# Patient Record
Sex: Female | Born: 1945 | Race: White | Hispanic: Yes | Marital: Married | Smoking: Never smoker
Health system: Southern US, Community
[De-identification: ages and names within clinical notes are randomized; demographics above are authoritative.]

## PROBLEM LIST (undated history)

## (undated) DIAGNOSIS — K3184 Gastroparesis: Secondary | ICD-10-CM

## (undated) DIAGNOSIS — I10 Essential (primary) hypertension: Secondary | ICD-10-CM

## (undated) DIAGNOSIS — M1990 Unspecified osteoarthritis, unspecified site: Secondary | ICD-10-CM

## (undated) DIAGNOSIS — F419 Anxiety disorder, unspecified: Secondary | ICD-10-CM

## (undated) DIAGNOSIS — E785 Hyperlipidemia, unspecified: Secondary | ICD-10-CM

## (undated) DIAGNOSIS — F32A Depression, unspecified: Secondary | ICD-10-CM

## (undated) DIAGNOSIS — D649 Anemia, unspecified: Secondary | ICD-10-CM

## (undated) DIAGNOSIS — T7840XA Allergy, unspecified, initial encounter: Secondary | ICD-10-CM

## (undated) DIAGNOSIS — Z5189 Encounter for other specified aftercare: Secondary | ICD-10-CM

## (undated) DIAGNOSIS — J40 Bronchitis, not specified as acute or chronic: Secondary | ICD-10-CM

## (undated) DIAGNOSIS — F329 Major depressive disorder, single episode, unspecified: Secondary | ICD-10-CM

## (undated) DIAGNOSIS — K760 Fatty (change of) liver, not elsewhere classified: Secondary | ICD-10-CM

## (undated) DIAGNOSIS — K219 Gastro-esophageal reflux disease without esophagitis: Secondary | ICD-10-CM

## (undated) HISTORY — DX: Depression, unspecified: F32.A

## (undated) HISTORY — DX: Major depressive disorder, single episode, unspecified: F32.9

## (undated) HISTORY — PX: LITHOTRIPSY: SUR834

## (undated) HISTORY — DX: Hyperlipidemia, unspecified: E78.5

## (undated) HISTORY — DX: Anxiety disorder, unspecified: F41.9

## (undated) HISTORY — DX: Fatty (change of) liver, not elsewhere classified: K76.0

## (undated) HISTORY — PX: CARDIAC SURGERY: SHX584

## (undated) HISTORY — DX: Allergy, unspecified, initial encounter: T78.40XA

## (undated) HISTORY — DX: Gastro-esophageal reflux disease without esophagitis: K21.9

## (undated) HISTORY — DX: Unspecified osteoarthritis, unspecified site: M19.90

## (undated) HISTORY — DX: Bronchitis, not specified as acute or chronic: J40

## (undated) HISTORY — DX: Gastroparesis: K31.84

## (undated) HISTORY — DX: Essential (primary) hypertension: I10

## (undated) HISTORY — PX: APPENDECTOMY: SHX54

## (undated) HISTORY — DX: Encounter for other specified aftercare: Z51.89

## (undated) HISTORY — DX: Anemia, unspecified: D64.9

---

## 1999-01-14 ENCOUNTER — Other Ambulatory Visit: Admission: RE | Admit: 1999-01-14 | Discharge: 1999-01-14 | Payer: Self-pay | Admitting: Gynecology

## 2000-02-24 ENCOUNTER — Other Ambulatory Visit: Admission: RE | Admit: 2000-02-24 | Discharge: 2000-02-24 | Payer: Self-pay | Admitting: Gynecology

## 2000-02-25 ENCOUNTER — Encounter (INDEPENDENT_AMBULATORY_CARE_PROVIDER_SITE_OTHER): Payer: Self-pay | Admitting: Specialist

## 2000-02-25 ENCOUNTER — Other Ambulatory Visit: Admission: RE | Admit: 2000-02-25 | Discharge: 2000-02-25 | Payer: Self-pay | Admitting: Gynecology

## 2000-05-11 ENCOUNTER — Ambulatory Visit (HOSPITAL_COMMUNITY): Admission: RE | Admit: 2000-05-11 | Discharge: 2000-05-11 | Payer: Self-pay | Admitting: Gynecology

## 2000-05-11 ENCOUNTER — Encounter (INDEPENDENT_AMBULATORY_CARE_PROVIDER_SITE_OTHER): Payer: Self-pay

## 2000-08-26 ENCOUNTER — Encounter (INDEPENDENT_AMBULATORY_CARE_PROVIDER_SITE_OTHER): Payer: Self-pay | Admitting: Specialist

## 2000-08-26 ENCOUNTER — Other Ambulatory Visit: Admission: RE | Admit: 2000-08-26 | Discharge: 2000-08-26 | Payer: Self-pay | Admitting: Gynecology

## 2001-02-23 ENCOUNTER — Other Ambulatory Visit: Admission: RE | Admit: 2001-02-23 | Discharge: 2001-02-23 | Payer: Self-pay | Admitting: Gynecology

## 2002-03-02 ENCOUNTER — Encounter: Admission: RE | Admit: 2002-03-02 | Discharge: 2002-03-02 | Payer: Self-pay | Admitting: Family Medicine

## 2002-03-02 ENCOUNTER — Encounter: Payer: Self-pay | Admitting: Family Medicine

## 2003-01-03 ENCOUNTER — Other Ambulatory Visit: Admission: RE | Admit: 2003-01-03 | Discharge: 2003-01-03 | Payer: Self-pay | Admitting: Gynecology

## 2004-03-06 ENCOUNTER — Other Ambulatory Visit: Admission: RE | Admit: 2004-03-06 | Discharge: 2004-03-06 | Payer: Self-pay | Admitting: Gynecology

## 2004-11-02 ENCOUNTER — Encounter (INDEPENDENT_AMBULATORY_CARE_PROVIDER_SITE_OTHER): Payer: Self-pay | Admitting: *Deleted

## 2004-11-02 LAB — CONVERTED CEMR LAB: Pap Smear: NORMAL

## 2005-07-16 ENCOUNTER — Encounter: Admission: RE | Admit: 2005-07-16 | Discharge: 2005-07-16 | Payer: Self-pay | Admitting: Family Medicine

## 2005-08-13 ENCOUNTER — Other Ambulatory Visit: Admission: RE | Admit: 2005-08-13 | Discharge: 2005-08-13 | Payer: Self-pay | Admitting: Gynecology

## 2005-09-09 ENCOUNTER — Ambulatory Visit: Payer: Self-pay | Admitting: Pulmonary Disease

## 2006-06-03 ENCOUNTER — Ambulatory Visit: Payer: Self-pay | Admitting: Family Medicine

## 2006-06-18 ENCOUNTER — Ambulatory Visit: Payer: Self-pay | Admitting: Gastroenterology

## 2006-07-01 ENCOUNTER — Ambulatory Visit: Payer: Self-pay | Admitting: Family Medicine

## 2006-07-28 ENCOUNTER — Ambulatory Visit: Payer: Self-pay | Admitting: Family Medicine

## 2006-08-06 ENCOUNTER — Ambulatory Visit: Payer: Self-pay | Admitting: Family Medicine

## 2006-08-25 ENCOUNTER — Ambulatory Visit: Payer: Self-pay | Admitting: Family Medicine

## 2006-09-30 ENCOUNTER — Ambulatory Visit: Payer: Self-pay | Admitting: Family Medicine

## 2006-09-30 LAB — CONVERTED CEMR LAB
Albumin: 3.8 g/dL (ref 3.5–5.2)
Alkaline Phosphatase: 84 units/L (ref 39–117)
CO2: 30 meq/L (ref 19–32)
Glucose, Bld: 90 mg/dL (ref 70–99)
Total Bilirubin: 0.9 mg/dL (ref 0.3–1.2)
Total Protein: 7.2 g/dL (ref 6.0–8.3)

## 2006-11-16 DIAGNOSIS — F32A Depression, unspecified: Secondary | ICD-10-CM | POA: Insufficient documentation

## 2006-11-16 DIAGNOSIS — F329 Major depressive disorder, single episode, unspecified: Secondary | ICD-10-CM

## 2006-11-16 DIAGNOSIS — F419 Anxiety disorder, unspecified: Secondary | ICD-10-CM

## 2007-01-04 ENCOUNTER — Ambulatory Visit: Payer: Self-pay | Admitting: Gastroenterology

## 2007-01-04 LAB — CONVERTED CEMR LAB
AST: 21 units/L (ref 0–37)
Albumin: 3.6 g/dL (ref 3.5–5.2)
Amylase: 54 units/L (ref 27–131)
Bilirubin, Direct: 0.2 mg/dL (ref 0.0–0.3)
Creatinine, Ser: 0.7 mg/dL (ref 0.4–1.2)
Eosinophils Absolute: 0.1 10*3/uL (ref 0.0–0.6)
Eosinophils Relative: 1.9 % (ref 0.0–5.0)
Folate: >20 ng/mL
Glucose, Bld: 103 mg/dL — ABNORMAL HIGH (ref 70–99)
Hemoglobin: 13 g/dL (ref 12.0–15.0)
Lymphocytes Relative: 21.1 % (ref 12.0–46.0)
MCV: 87.6 fL (ref 78.0–100.0)
Monocytes Absolute: 0.3 10*3/uL (ref 0.2–0.7)
Monocytes Relative: 5.3 % (ref 3.0–11.0)
Neutro Abs: 4.4 10*3/uL (ref 1.4–7.7)
Platelets: 303 10*3/uL (ref 150–400)
Potassium: 4.2 meq/L (ref 3.5–5.1)
Sed Rate: 34 mm/hr — ABNORMAL HIGH (ref 0–25)
Sodium: 141 meq/L (ref 135–145)
Total Bilirubin: 0.8 mg/dL (ref 0.3–1.2)
Total Protein: 6.9 g/dL (ref 6.0–8.3)
WBC: 6.1 10*3/uL (ref 4.5–10.5)

## 2007-01-07 ENCOUNTER — Ambulatory Visit: Payer: Self-pay | Admitting: Gastroenterology

## 2007-01-10 ENCOUNTER — Ambulatory Visit: Payer: Self-pay | Admitting: Gastroenterology

## 2007-01-10 ENCOUNTER — Encounter (INDEPENDENT_AMBULATORY_CARE_PROVIDER_SITE_OTHER): Payer: Self-pay | Admitting: Specialist

## 2007-02-10 ENCOUNTER — Ambulatory Visit: Payer: Self-pay | Admitting: Gastroenterology

## 2007-07-06 ENCOUNTER — Telehealth (INDEPENDENT_AMBULATORY_CARE_PROVIDER_SITE_OTHER): Payer: Self-pay | Admitting: *Deleted

## 2008-02-14 ENCOUNTER — Other Ambulatory Visit: Admission: RE | Admit: 2008-02-14 | Discharge: 2008-02-14 | Payer: Self-pay | Admitting: Gynecology

## 2009-02-19 ENCOUNTER — Ambulatory Visit: Payer: Self-pay | Admitting: Gynecology

## 2009-02-19 ENCOUNTER — Encounter: Payer: Self-pay | Admitting: Gynecology

## 2009-02-19 ENCOUNTER — Other Ambulatory Visit: Admission: RE | Admit: 2009-02-19 | Discharge: 2009-02-19 | Payer: Self-pay | Admitting: Gynecology

## 2009-03-01 ENCOUNTER — Encounter: Payer: Self-pay | Admitting: Internal Medicine

## 2009-05-01 ENCOUNTER — Ambulatory Visit: Payer: Self-pay | Admitting: Internal Medicine

## 2009-05-01 ENCOUNTER — Ambulatory Visit: Payer: Self-pay | Admitting: Diagnostic Radiology

## 2009-05-01 ENCOUNTER — Ambulatory Visit (HOSPITAL_BASED_OUTPATIENT_CLINIC_OR_DEPARTMENT_OTHER): Admission: RE | Admit: 2009-05-01 | Discharge: 2009-05-01 | Payer: Self-pay | Admitting: Internal Medicine

## 2009-05-01 DIAGNOSIS — E785 Hyperlipidemia, unspecified: Secondary | ICD-10-CM | POA: Insufficient documentation

## 2009-05-02 HISTORY — PX: OTHER SURGICAL HISTORY: SHX169

## 2009-05-07 ENCOUNTER — Ambulatory Visit: Payer: Self-pay | Admitting: Cardiovascular Disease

## 2009-05-08 ENCOUNTER — Encounter: Payer: Self-pay | Admitting: Internal Medicine

## 2009-05-08 ENCOUNTER — Telehealth: Payer: Self-pay | Admitting: Internal Medicine

## 2009-05-14 ENCOUNTER — Encounter (INDEPENDENT_AMBULATORY_CARE_PROVIDER_SITE_OTHER): Payer: Self-pay | Admitting: *Deleted

## 2009-05-21 ENCOUNTER — Encounter: Payer: Self-pay | Admitting: Internal Medicine

## 2009-05-21 ENCOUNTER — Ambulatory Visit: Payer: Self-pay

## 2009-05-29 ENCOUNTER — Encounter (INDEPENDENT_AMBULATORY_CARE_PROVIDER_SITE_OTHER): Payer: Self-pay | Admitting: *Deleted

## 2009-07-10 ENCOUNTER — Telehealth (INDEPENDENT_AMBULATORY_CARE_PROVIDER_SITE_OTHER): Payer: Self-pay | Admitting: *Deleted

## 2009-08-02 ENCOUNTER — Ambulatory Visit: Payer: Self-pay | Admitting: Internal Medicine

## 2009-08-02 DIAGNOSIS — J309 Allergic rhinitis, unspecified: Secondary | ICD-10-CM | POA: Insufficient documentation

## 2009-08-08 ENCOUNTER — Encounter (INDEPENDENT_AMBULATORY_CARE_PROVIDER_SITE_OTHER): Payer: Self-pay | Admitting: *Deleted

## 2009-08-08 LAB — CONVERTED CEMR LAB
BUN: 18 mg/dL (ref 6–23)
CO2: 29 meq/L (ref 19–32)
Calcium: 9 mg/dL (ref 8.4–10.5)
Chloride: 107 meq/L (ref 96–112)
Creatinine, Ser: 0.7 mg/dL (ref 0.4–1.2)
Glucose, Bld: 82 mg/dL (ref 70–99)
HDL: 38.6 mg/dL — ABNORMAL LOW (ref >39.00)
LDL Cholesterol: 87 mg/dL (ref 0–99)
Total CHOL/HDL Ratio: 4
VLDL: 26.6 mg/dL (ref 0.0–40.0)

## 2009-11-21 ENCOUNTER — Telehealth (INDEPENDENT_AMBULATORY_CARE_PROVIDER_SITE_OTHER): Payer: Self-pay | Admitting: *Deleted

## 2009-11-22 ENCOUNTER — Ambulatory Visit: Payer: Self-pay | Admitting: Internal Medicine

## 2009-11-22 DIAGNOSIS — M1990 Unspecified osteoarthritis, unspecified site: Secondary | ICD-10-CM | POA: Insufficient documentation

## 2009-12-27 ENCOUNTER — Ambulatory Visit: Payer: Self-pay | Admitting: Gynecology

## 2010-01-08 ENCOUNTER — Telehealth (INDEPENDENT_AMBULATORY_CARE_PROVIDER_SITE_OTHER): Payer: Self-pay | Admitting: *Deleted

## 2010-03-07 ENCOUNTER — Ambulatory Visit: Payer: Self-pay | Admitting: Internal Medicine

## 2010-05-02 ENCOUNTER — Encounter: Payer: Self-pay | Admitting: Internal Medicine

## 2010-05-20 ENCOUNTER — Telehealth (INDEPENDENT_AMBULATORY_CARE_PROVIDER_SITE_OTHER): Payer: Self-pay | Admitting: *Deleted

## 2010-06-11 ENCOUNTER — Ambulatory Visit: Payer: Self-pay | Admitting: Internal Medicine

## 2010-06-11 DIAGNOSIS — J019 Acute sinusitis, unspecified: Secondary | ICD-10-CM | POA: Insufficient documentation

## 2010-06-23 ENCOUNTER — Telehealth: Payer: Self-pay | Admitting: Internal Medicine

## 2010-07-14 ENCOUNTER — Telehealth: Payer: Self-pay | Admitting: Internal Medicine

## 2010-07-16 ENCOUNTER — Ambulatory Visit: Payer: Self-pay | Admitting: Internal Medicine

## 2010-07-17 ENCOUNTER — Ambulatory Visit: Payer: Self-pay | Admitting: Internal Medicine

## 2010-07-17 ENCOUNTER — Telehealth: Payer: Self-pay | Admitting: Internal Medicine

## 2010-07-25 ENCOUNTER — Telehealth (INDEPENDENT_AMBULATORY_CARE_PROVIDER_SITE_OTHER): Payer: Self-pay | Admitting: *Deleted

## 2010-08-08 ENCOUNTER — Ambulatory Visit: Payer: Self-pay | Admitting: Internal Medicine

## 2010-08-11 ENCOUNTER — Encounter (INDEPENDENT_AMBULATORY_CARE_PROVIDER_SITE_OTHER): Payer: Self-pay | Admitting: *Deleted

## 2010-08-13 ENCOUNTER — Ambulatory Visit: Payer: Self-pay | Admitting: Internal Medicine

## 2010-08-15 LAB — CONVERTED CEMR LAB
AST: 21 units/L (ref 0–37)
Basophils Absolute: 0 10*3/uL (ref 0.0–0.1)
Cholesterol: 179 mg/dL (ref 0–200)
Eosinophils Relative: 2.3 % (ref 0.0–5.0)
HCT: 37.4 % (ref 36.0–46.0)
Hemoglobin: 12.6 g/dL (ref 12.0–15.0)
Lymphocytes Relative: 27 % (ref 12.0–46.0)
Lymphs Abs: 1.3 10*3/uL (ref 0.7–4.0)
Monocytes Relative: 5.3 % (ref 3.0–12.0)
Neutro Abs: 3.1 10*3/uL (ref 1.4–7.7)
Platelets: 266 10*3/uL (ref 150.0–400.0)
RDW: 15.4 % — ABNORMAL HIGH (ref 11.5–14.6)
TSH: 1.71 microintl units/mL (ref 0.35–5.50)
Triglycerides: 139 mg/dL (ref 0.0–149.0)
WBC: 4.8 10*3/uL (ref 4.5–10.5)

## 2010-08-18 ENCOUNTER — Encounter: Payer: Self-pay | Admitting: Internal Medicine

## 2010-08-22 ENCOUNTER — Encounter: Payer: Self-pay | Admitting: Internal Medicine

## 2010-08-26 ENCOUNTER — Encounter: Payer: Self-pay | Admitting: Internal Medicine

## 2010-11-23 ENCOUNTER — Encounter: Payer: Self-pay | Admitting: Family Medicine

## 2010-12-02 NOTE — Progress Notes (Signed)
Summary: discussed chest x-ray  Phone Note Outgoing Call   Summary of Call: called ----> no answer Jose E. Paz MD  July 17, 2010 1:57 PM   Follow-up for Phone Call        I spoke with the patient's daughter in reference to the chest x-ray results "Question of retained suture needle right post lateral chest wall."  the daughter reports that the only surgery was her heart which was 67 years old. They were not aware of this situation. Plan: Will call if she has any pain, swelling, redness in that area Follow-up by: Jose E. Paz MD,  July 18, 2010 1:16 PM

## 2010-12-02 NOTE — Progress Notes (Signed)
Summary: NEEDS NEW ANTIBIOTIC  Phone Note Call from Patient   Caller: Patient Summary of Call: PATIENT'S DAUGHTER CALLED TO REPORT THAT [facility] MOM'S ANTIBIOTIC IS MAKING HER SICK, SO SHE HAS STOPPED TAKING IT  SINCE WE HAD NO SIGNED DESIGNATED PARTY RELEASE FROM PATIENT, I TOLD DAUGHTER I COULD NOT SPEAK TO HER--AND I ASKED  HER TO HAVE HER MOM CALL  PATIENT DID CALL AND ASKED FOR NEW ANTIBIOTIC FOR [facility] [address]---I SAID I WOULD TELL DR PAZ  Initial call taken by: Jerolyn Shin,  July 14, 2010 1:08 PM  Follow-up for Phone Call        was seen more than a month ago with sinusitis. If she still needs antibiotics for a sinus infection, she actually needs to be seen.please arrange Follow-up by: Jose E. Paz MD,  July 14, 2010 4:04 PM  Additional Follow-up for Phone Call Additional follow up Details #1::        Left message for pt to call back. Army Fossa CMA  July 14, 2010 4:07 PM     Additional Follow-up for Phone Call Additional follow up Details #2::    Unable to reach pt, everytime I call a female who only speaks spanish and noone to translate. Army Fossa CMA  July 15, 2010 1:34 PM called, no answer Jose E. Paz MD  July 15, 2010 2:00 PM   Additional Follow-up for Phone Call Additional follow up Details #3:: Details for Additional Follow-up Action Taken: Patient came in with a personal translator asking about her antibiotic. I informed her that she needed to be seen today to be able to get a new rx for the abx. Patient made appt for this morning.  Additional Follow-up by: Harold Barban,  July 16, 2010 11:14 AM

## 2010-12-02 NOTE — Progress Notes (Signed)
Summary: [facility] 3/9  Phone Note Call from Patient Call back at [phone]   Caller: Daughter Summary of Call: Request refill on lipitor to [facility] Left message on machine for daughter -- which [facility] pharmacy? Regina Ray  January 08, 2010 8:40 AM     Prescriptions: LIPITOR 10 MG TABS (ATORVASTATIN CALCIUM) 1/2 a day  #15 x 3   Entered by:   Regina Ray   Authorized by:   Nolon Rod. Paz MD   Signed by:   Regina Ray on 01/08/2010   Method used:   Electronically to        [facility] [address] [place] [facility] (retail)       [id number]       [place], [place]  [address]       Ph: [phone]       Fax: [phone]   RxID:   [id number]

## 2010-12-02 NOTE — Progress Notes (Signed)
Summary: BLEEDING DUE TO CONSTIPATION  Phone Note Call from Patient   [facility] - [phone] Summary of Call: SPOKE TO PATIENT ON PHONE---SHE SAID IT WAS OK TO TALK TO HER DAUGHTER  DAUGHTER SAYS THAT PATIENT IS HAVING CONSTIPATION PROBLEMS DUE TO ANTIBIOTIC---VERY DIFFICULT TO PASS STOOL ---HAS BLEEDING ISSUES---SO HAS STOPPED ANTIBIOTIC  DAUGHTER SAYS SHE STARTED PAPAYA AND YOGURT ON MONDAY TO "ADD FIBER", THEN ADDED PRUNE JUICE ON THURSDAY  HAS NO HISTORY OF BLEEDING ULCERS; SAYS BELLY FEELS HOT, HAS NOT HAD CRAMPING--  WHAT SHOULD SHE DO NEXT??   Initial call taken by: Jerolyn Shin,  July 25, 2010 3:15 PM  Follow-up for Phone Call        pt rx DOXYCYCLINE HYCLATE 100 MG and prednisone pls advise.Marti Sleigh Deloach CMA  July 25, 2010 4:38 PM   spoke w/ patient daughter says she had episode of on monday and today where she couldn't make a BM has been straining due to constipation has since increase fluids and drinking prune just but has been going informed daughter that she may have hemorrhoid states that blood in bright red but very minimal informed to continue medication along w/ recommendations and call if needed...Marland KitchenMarland KitchenDoristine Devoid CMA  July 25, 2010 5:09 PM

## 2010-12-02 NOTE — Consult Note (Signed)
Summary: back pain eval---Dr [facility]   Imported By: Lanelle Bal 05/19/2010 12:40:55  _____________________________________________________________________  External Attachment:    Type:   Image     Comment:   External Document

## 2010-12-02 NOTE — Progress Notes (Signed)
Summary: NEEDS TWO PRESCRIPTIONS  Phone Note Call from Patient Call back at [phone]= [name] = DAUGHTER   Caller: Patient Summary of Call: PATIENT NEEDS RFILL FOR LIPITOR AND WANTS TO TRY HIGH BLOOD PRESSURE MEDICATION THAT SHE AND DR PAZ HAVE BEEN TALKING ABOUT  CALL [facility] FOR PRESCRIPTIONS Initial call taken by: Jerolyn Shin,  June 23, 2010 12:43 PM  Follow-up for Phone Call        OK Lipitor #30,3 refills Will discuss BP meds when she comes back. BP the last time she was here was okay She is due for a CPX on October, please schedule Follow-up by: [facility] E. Paz MD,  June 23, 2010 3:27 PM  Additional Follow-up for Phone Call Additional follow up Details #1::        pts daughter is aware, CPX schedule for October. Army Fossa CMA  June 23, 2010 3:32 PM     Prescriptions: LIPITOR 10 MG TABS (ATORVASTATIN CALCIUM) 1/2 a day  #30 x 3   Entered by:   Army Fossa CMA   Authorized by:   Nolon Rod. Paz MD   Signed by:   Army Fossa CMA on 06/23/2010   Method used:   Electronically to        [facility] [address] [place] (retail)       [phone]       [place], [place]  [address]       Ph: [phone]       Fax: [phone]   RxID:   [id number]

## 2010-12-02 NOTE — Assessment & Plan Note (Signed)
Summary: elevated bp/alr   Vital Signs:  Patient profile:   65 year old female Height:      58 inches Weight:      180.4 pounds BMI:     37.84 Pulse rate:   76 / minute BP sitting:   140 / 80  Vitals Entered By: Regina Ray (November 22, 2009 11:41 AM) CC: rov   History of Present Illness: several issues  her BP was checked one time at [facility] last week and he was in the 175 range, thinks she needs  totreatment  also complained of back pain and   left knee pain was eval.  by a chiropractor, then referred for local injections in her back in 2010, she had  3 injections but doesn't like to have any more because she is gaining weight, wonders if she needs to see a rheumatologist currently not taking any medicines  depression?   Spends a lot of time by herself at home , denies crying spells but she feels down and somehow tired. unable to explain her symptoms any further   Wonders if she needs an antidepressant   Current Medications (verified): 1)  Lipitor 10 Mg Tabs (Atorvastatin Calcium) .... 1/2 A Day 2)  Mvi 3)  Calcium, D  Allergies (verified): 1)  ! Zoloft 2)  ! * Decongestants 3)  ! Penicillin  Past History:  Past Medical History: Anxiety, Depression Hyperlipidemia Allergic rhinitis ?claudication: ABIs normal 05/2009   Osteoarthritis  Past Surgical History: Reviewed history from 05/01/2009 and no changes required. Heart surgery @ age 21 (Intraauricular communication) Appendectomy  Social History: Reviewed history from 05/01/2009 and no changes required. tobacco--no no ETOH Married  Review of Systems       no chest pain occ.  has dizziness, symptoms are mild, no associated with nausea.  Symptoms ongoing on for years  Physical Exam  General:  alert and well-developed.   Lungs:  normal respiratory effort, no intercostal retractions, no accessory muscle use, and normal breath sounds.   Heart:  normal rate, regular rhythm, and no murmur.     Extremities:  No edema   Impression & Recommendations:  Problem # 1:  ? of ELEVATED BLOOD PRESSURE (ICD-796.2) explained to patient that the diagnosis of hypertension is based on several readings BP today  normal Plan: Check BP every two days low salt diet reassess in one month  Problem # 2:  ? of DEPRESSION (ICD-311) hard to say if she is depressed recommend observation for now  Problem # 3:  OSTEOARTHRITIS (ICD-715.90) per pt : her gynecologist refer her to a chiropractor, then she was referred for local injections, see HPI she wonders if needs to see a rheumatologist, I recommend to try medication first Plan: tylenol  Vicodin if pain persists instructions provided in Spanish  The following medications were removed from the medication list:    Aspirin 81 Mg Tbec (Aspirin) .Marland Kitchen... 1 a day Her updated medication list for this problem includes:    Vicodin Es 7.5-750 Mg Tabs (Hydrocodone-acetaminophen) .Marland Kitchen... 1 every 6 hours as needed pain  Complete Medication List: 1)  Lipitor 10 Mg Tabs (Atorvastatin calcium) .... 1/2 a day 2)  Mvi  3)  Calcium, D  4)  Vicodin Es 7.5-750 Mg Tabs (Hydrocodone-acetaminophen) .Marland Kitchen.. 1 every 6 hours as needed pain  Patient Instructions: 1)  tylenol 500 mg 1 o 2 pastillas cada 6 horas solo si tiene dolor 2)  si el dolor es severo, tome vicodin 1 pastilla cada 6  horas  y no tylenol. Vicodin puede causar somnolencia 3)  chequee su presion 3 o 4 veces por semana, debe estar menos de 140/80. Si esta siempre mas alta que 140/80 , por favor llamenos 4)  dieta baja de sal 5)  regrese en un mes  6)  Please schedule a follow-up appointment in 1 month.  Prescriptions: VICODIN ES 7.5-750 MG TABS (HYDROCODONE-ACETAMINOPHEN) 1 every 6 hours as needed pain  #40 x 0   Entered and Authorized by:   Nolon Rod. Paz MD   Signed by:   Nolon Rod. Paz MD on 11/22/2009   Method used:   Print then Give to Patient   RxID:   [id number]

## 2010-12-02 NOTE — Assessment & Plan Note (Signed)
Summary: SINUS INF////[facility]   Vital Signs:  Patient profile:   65 year old female Weight:      177.25 pounds Pulse rate:   74 / minute Pulse rhythm:   regular BP sitting:   130 / 72  (left arm) Cuff size:   regular  Vitals Entered By: Army Fossa CMA (June 11, 2010 2:54 PM) CC: sinus infection?  Comments HA Ears hurt x 5 days   History of Present Illness: 2 days history of "sinus infection" right ear ache, sinus pressure and congestion, mild cough with some sputum She also feels dizzy/ "imbalance" when she stands up or move her head has taken some Advil  ROS No fever some chest congestion congestion Has a headache, global Denies a slurred speech, focal deficits or diplopia Had nausea for a few hours with the onset of symptoms   Current Medications (verified): 1)  Lipitor 10 Mg Tabs (Atorvastatin Calcium) .... 1/2 A Day  Allergies: 1)  ! Zoloft 2)  ! * Decongestants 3)  ! Penicillin  Past History:  Past Medical History: Reviewed history from 11/22/2009 and no changes required. Anxiety, Depression Hyperlipidemia Allergic rhinitis ?claudication: ABIs normal 05/2009   Osteoarthritis  Past Surgical History: Reviewed history from 05/01/2009 and no changes required. Heart surgery @ age 66 (Intraauricular communication) Appendectomy  Social History: Reviewed history from 05/01/2009 and no changes required. tobacco--no no ETOH Married  Review of Systems      See HPI  Physical Exam  General:  alert and well-developed.   Head:  face symmetric, bilateral tenderness in the maxillary sinuses, very mild in the frontal sinuses Ears:  R ear normal and L ear normal.   Nose:  slightly congested Mouth:  no redness or discharge Lungs:  normal respiratory effort, no intercostal retractions, no accessory muscle use, and normal breath sounds.   Heart:  normal rate, regular rhythm, and no murmur.   Neurologic:  EOMI  face is symmetric Speech fluent Gait  normal Psych:  not anxious appearing and not depressed appearing.     Impression & Recommendations:  Problem # 1:  SINUSITIS - ACUTE-NOS (ICD-461.9) symptoms consistent with mild sinusitis Patient is allergic to penicillin rest, fluids,   Bactrim (avoid excessive sun exposure), robitussin. See instructions ( in Spanish)  Complete Medication List: 1)  Lipitor 10 Mg Tabs (Atorvastatin calcium) .... 1/2 a day 2)  Bactrim Ds 800-160 Mg Tabs (Sulfamethoxazole-trimethoprim) .... One by mouth twice a day  for 10 days  Patient Instructions: 1)  descanse, tome muchos liquidos 2)  tylenol para el dolor de cabeza 3)  Robitussin DM para la tos  4)  bactrim, es el antibiotico 5)  llame si no esta mejor en unos dias 6)  llame si se pone peor, tiene fiebre alta , dolor de cabeza fuerte Prescriptions: BACTRIM DS 800-160 MG TABS (SULFAMETHOXAZOLE-TRIMETHOPRIM) one by mouth twice a day  for 10 days  #20 x 0   Entered and Authorized by:   Nolon Rod. Paz MD   Signed by:   Nolon Rod. Paz MD on 06/11/2010   Method used:   Electronically to        [facility] [address] (retail)       [phone]       [place], [place]  [address]       Ph: [phone]       Fax: [phone]   RxID:   [id number]

## 2010-12-02 NOTE — Progress Notes (Signed)
  Phone Note Call from Patient   Caller: Patient Summary of Call: called and left msg on VM  would like appt for tomorrow, please cal. Called pt back phone just rings no answering machine, will try later  Called pt back who c/o elevated bp (very little english) ov scheduled  Initial call taken by: Kandice Hams,  November 21, 2009 9:07 AM

## 2010-12-02 NOTE — Assessment & Plan Note (Signed)
Summary: CPX/drb   Vital Signs:  Patient profile:   65 year old female Height:      58 inches Weight:      176.25 pounds Pulse rate:   78 / minute Pulse rhythm:   regular BP sitting:   124 / 76  (left arm) Cuff size:   regular  Vitals Entered By: Army Fossa CMA (August 08, 2010 2:20 PM) CC: CPX, not fasting Comments [facility] [address] flu shot Had pneumovax 2 yearrs ago Shingles?? Due for bone density mammo and pap-- has GYN    History of Present Illness: Here for Medicare AWV:  1.   Risk factors based on Past M, S, F history: reviewed 2.   Physical Activities: sedentary life style  3.   Depression/mood: occasional depression, son in jail 4.   Hearing: denies problems, no problems noted  5.   ADL's: totally independent, does not drive  6.   Fall Risk: no recent falls  7.   Home Safety: does feel safe at home 8.   Height, weight, &visual acuity: see  vital signs, only needs reading glasses  9.   Counseling: see below 10.   Labs ordered based on risk factors: yes 11.           Referral Coordination, if needed 12.           Care Plan, see assessment and plan 13.            Cognitive Assessment , motor skil, memory and cognition seem appropiate   In addition, we discussed the following issues  Anxiety, Depression-- on no meds, doing relatively well  Hyperlipidemia-- good medication compliance  OA -- saw Dr Ethelene Hal 7-11, local injection helped    Preventive Screening-Counseling & Management  Alcohol-Tobacco     Smoking Status: never  Caffeine-Diet-Exercise     Does Patient Exercise: yes     Type of exercise: walking  Current Medications (verified): 1)  Lipitor 10 Mg Tabs (Atorvastatin Calcium) .... 1/2 A Day  Allergies (verified): 1)  ! Zoloft 2)  ! * Decongestants 3)  ! Penicillin  Past History:  Past Medical History: Anxiety, Depression Hyperlipidemia Allergic rhinitis ?claudication: ABIs normal 05/2009 Osteoarthritis fatty liver per ultrasound  2008 EGD showed gastroparesis 2008  Past Surgical History: Reviewed history from 05/01/2009 and no changes required. Heart surgery @ age 85 Paramedic communication) Appendectomy  Social History: married, lives w/ husband 4 children does not drive tobacco--no ETOH--no   Smoking Status:  never Does Patient Exercise:  yes  Review of Systems General:  occasional fatigue . CV:  Denies chest pain or discomfort and swelling of feet. Resp:  Denies cough and shortness of breath. GI:  Denies bloody stools, diarrhea, nausea, and vomiting; occasional constipation . Neuro:  Denies poor balance; no HA.  Physical Exam  General:  alert, well-developed, and overweight-appearing.   Neck:  no masses and no thyromegaly.   Chest Wall:  no obvious mass on the right chest Lungs:  normal respiratory effort, no intercostal retractions, no accessory muscle use, and normal breath sounds.   Heart:  normal rate, regular rhythm, and no murmur.   Abdomen:  soft, non-tender, no distention, no masses, no guarding, and no rigidity.   Extremities:  no pretibial edema bilaterally  Psych:  Cognition and judgment appear intact. Alert and cooperative with normal attention span and concentration, not anxious appearing and not depressed appearing.     Impression & Recommendations:  Problem # 1:  HEALTH  SCREENING (ICD-V70.0)  TD-- years ago , will get another one on return to the office Pneumonia shot-- aprox 2009 Shingles immunization--information provided flu shot today   not on ASA d/t GI s/e   overdue to see  Dr. Lily Peer , encouraged to go , patient will call  Pap smear  per gyn  Mammogram-negative   01-2009, overdue , I'll schedule it  Bone density test-- had it done before at gyn, rec to D/W Dr Lily Peer   Colonoscopy 01-2007===== negative except for hyperplastic polyps, Dr. Jarold Motto  diet and exercise discussed  Orders: Radiology Referral (Radiology) Medicare -1st Annual Wellness  Visit [phone])  Problem # 2:  foreign body retained needle in the right side of the chest per last chest x-ray, it was an incidental finding. The patient likes to recheck on that in a few months  Problem # 3:  ? of ELEVATED BLOOD PRESSURE (ICD-796.2) BP normal BP today: 124/76 Prior BP: 130/86 (07/16/2010)     Problem # 4:  OSTEOARTHRITIS (ICD-715.90) symptoms relatively well controlled now  Problem # 5:  HYPERLIPIDEMIA (ICD-272.4) due for labs Her updated medication list for this problem includes:    Lipitor 10 Mg Tabs (Atorvastatin calcium) .Marland Kitchen... 1/2 a day  Labs Reviewed: SGOT: 28 (08/02/2009)   SGPT: 25 (08/02/2009)   HDL:38.60 (08/02/2009)  LDL:87 (08/02/2009)  Chol:152 (08/02/2009)  Trig:133.0 (08/02/2009)  Problem # 6:  ANXIETY (ICD-300.00) on no medications, seems to be doing okay  Complete Medication List: 1)  Lipitor 10 Mg Tabs (Atorvastatin calcium) .... 1/2 a day  Other Orders: Flu Vaccine 57yrs + MEDICARE PATIENTS (U0454) Administration Flu vaccine - MCR (U9811)  Patient Instructions: 1)  came back fasting 2)  FLP, AST,  ALT, TSH---dx high cholesterol 3)  CBC--- elevated BP 4)  Come back in one year for routine checkup   Risk Factors:  Tobacco use:  never Alcohol use:  no Exercise:  yes    Type:  walking  Flu Vaccine Consent Questions     Do you have a history of severe allergic reactions to this vaccine? no    Any prior history of allergic reactions to egg and/or gelatin? no    Do you have a sensitivity to the preservative Thimersol? no    Do you have a past history of Guillan-Barre Syndrome? no    Do you currently have an acute febrile illness? no    Have you ever had a severe reaction to latex? no    Vaccine information given and explained to patient? yes    Are you currently pregnant? no    Lot Number:[id number]   Exp Date:05/02/2011   Site Given  Left Deltoid IM    [email or web address]

## 2010-12-02 NOTE — Progress Notes (Signed)
Summary: Blood Pressure Rx  Phone Note Call from Patient   Caller: Patient Summary of Call: pt walks in and states that she was told at her last ov that if she wanted an rx for high blood pressure that she could stop by or call to receive this rx. spoke with danielle and the medication is not in her list. please call patient back to discuss her options [phone] Initial call taken by: Lavell Islam,  May 20, 2010 2:52 PM  Follow-up for Phone Call        if her ambulatory BP is consistently more than 160-90 she needs medication If not, just diet and exercise. She is due for a general checkup I recommend her to schedule one and we can talk more at the time Follow-up by: Jose E. Paz MD,  May 21, 2010 10:24 AM  Additional Follow-up for Phone Call Additional follow up Details #1::        Spoke with pt and she will have her daughter call back to set up appt.Lavell Islam  May 21, 2010 10:34 AM    Additional Follow-up for Phone Call Additional follow up Details #2::    mailed letter.Lavell Islam  May 26, 2010 9:53 AM

## 2010-12-02 NOTE — Assessment & Plan Note (Signed)
Summary: SINUS INFECTION//[facility]   Vital Signs:  Patient profile:   65 year old female Weight:      177 pounds Temp:     97.7 degrees F oral Pulse rate:   73 / minute Pulse rhythm:   regular BP sitting:   130 / 86  (left arm) Cuff size:   regular  Vitals Entered By: Army Fossa CMA (July 16, 2010 11:17 AM) WithCC: Pt here c/o sinus infection?  Comments C/o head congestion HA's Chest congestion    History of Present Illness: was seen a month ago with mild sinusitis was prescribed  Bactrim Not doing any better Continue with sinus congestion also has frequent cough with sputum, sputum was initially green, now yellow. Few days ago she saw a few drops of blood on the sputum  ROS No itchy eyes, some itchy nose and clear nasal discharge had fever last week but none in the last few days as you're some chest congestion and even wheezing. Denies history of asthma or tobacco abuse Admit to chest pain in the tracheal area only with cough No nausea, vomiting, diarrhea no lower extremity edema  Current Medications (verified): 1)  Lipitor 10 Mg Tabs (Atorvastatin Calcium) .... 1/2 A Day  Allergies: 1)  ! Zoloft 2)  ! * Decongestants 3)  ! Penicillin  Past History:  Past Medical History: Reviewed history from 11/22/2009 and no changes required. Anxiety, Depression Hyperlipidemia Allergic rhinitis ?claudication: ABIs normal 05/2009   Osteoarthritis  Past Surgical History: Reviewed history from 05/01/2009 and no changes required. Heart surgery @ age 2 (Intraauricular communication) Appendectomy  Social History: Reviewed history from 05/01/2009 and no changes required. tobacco--no no ETOH Married  Physical Exam  General:  alert, well-developed, and overweight-appearing.   Head:  face symmetric, not tender and the maxillary sinuses, mild tenderness at bilateral frontal sinuses Ears:  R ear normal and L ear normal.   Nose:  congested, clear discharge  noted Mouth:  no redness or discharge Lungs:  no intercostal retractions.  end expiratory wheezing a few rhonchi noted. No increased work of breathing Heart:  normal rate, regular rhythm, and no murmur.   Extremities:  normal extremity edema   Impression & Recommendations:  Problem # 1:  BRONCHITIS- ACUTE (ICD-466.0)  bronchitis and bronchospasm, no history of asthma. She did see a few drops of blood in the sputum 2 days ago I will start  steroids by mouth . The Consider inhalers if not better Plan: Chest x-ray, robitussin, doxycycline, steroids see  instructions in Spanish The following medications were removed from the medication list:    Bactrim Ds 800-160 Mg Tabs (Sulfamethoxazole-trimethoprim) ..... One by mouth twice a day  for 10 days Her updated medication list for this problem includes:    Doxycycline Hyclate 100 Mg Caps (Doxycycline hyclate) ..... One by mouth twice a day  Orders: T-2 View CXR (71020TC)  Complete Medication List: 1)  Lipitor 10 Mg Tabs (Atorvastatin calcium) .... 1/2 a day 2)  Doxycycline Hyclate 100 Mg Caps (Doxycycline hyclate) .... One by mouth twice a day 3)  Prednisone 10 Mg Tabs (Prednisone) .... 4 by mouth for 2 days, 3x2, 2x2, 1x2  Patient Instructions: 1)  para la tos: robitussin DM  2 cucharaditas cada 6 horas 2)  doxycycline (antibiotico) 3)  Prednisone por unos dias, siga las instrucciones  4)  llame si no esta mejor en unos dias  Prescriptions: PREDNISONE 10 MG TABS (PREDNISONE) 4 by mouth for 2 days, 3x2, 2x2, 1x2  #  20 x 0   Entered and Authorized by:   Nolon Rod. Paz MD   Signed by:   Nolon Rod. Paz MD on 07/16/2010   Method used:   Print then Give to Patient   RxID:   [id number] DOXYCYCLINE HYCLATE 100 MG CAPS (DOXYCYCLINE HYCLATE) one by mouth twice a day  #14 x 0   Entered and Authorized by:   Nolon Rod. Paz MD   Signed by:   Nolon Rod. Paz MD on 07/16/2010   Method used:   Print then Give to Patient   RxID:   [id number]

## 2010-12-02 NOTE — Letter (Signed)
Summary: Primary Care Consult Scheduled Letter  [facility] [place]  [address] [place], [place] [address]   Phone: [phone]  Fax: [phone]      08/11/2010 MRN: [id number]  [facility] [address] [place], [place]  [address]    Dear Ms. Dino,    We have scheduled an appointment for you.  At the recommendation of Dr. Willow Ora, we have scheduled you for a Screening Mammogram with [facility] on 08-18-2010 at 3:45pm.  Their address is [address] [place] [address]. The office phone number is [phone].  If this appointment day and time is not convenient for you, please feel free to call the office of the doctor you are being referred to at the number listed above and reschedule the appointment.    It is important for you to keep your scheduled appointments. We are here to make sure you are given good patient care.   Thank you,    Renee, Patient Care Coordinator [facility]

## 2010-12-02 NOTE — Assessment & Plan Note (Signed)
Summary: congested/cbs   Vital Signs:  Patient profile:   65 year old female Height:      58 inches Weight:      175 pounds BMI:     36.71 Temp:     97.9 degrees F BP sitting:   152 / 82  Vitals Entered By: [name] (Mar 07, 2010 2:15 PM) CC: acute only   History of Present Illness: Acute Visit 8-day history of chest congestion, cough bringing up white sputum mild headache mild sinus congestion  Allergies: 1)  ! Zoloft 2)  ! * Decongestants 3)  ! Penicillin  Past History:  Past Medical History: Reviewed history from 11/22/2009 and no changes required. Anxiety, Depression Hyperlipidemia Allergic rhinitis ?claudication: ABIs normal 05/2009   Osteoarthritis  Past Surgical History: Reviewed history from 05/01/2009 and no changes required. Heart surgery @ age 48 (Intraauricular communication) Appendectomy  Social History: Reviewed history from 05/01/2009 and no changes required. tobacco--no no ETOH Married  Review of Systems       denies fevers but sometimes feeling chilly no nausea or vomiting no myalgias other than  her regular aches and pains  CV:  no recent ambulatory BPs.  Physical Exam  General:  alert and well-developed.  nontoxic Head:  faces symmetric, not tender to palpation Ears:  R ear normal and L ear normal.   Nose:  slightly congested Mouth:  no redness Lungs:  normal respiratory effort, no intercostal retractions, no accessory muscle use, and normal breath sounds.   Heart:  normal rate, regular rhythm, and no murmur.      Impression & Recommendations:  Problem # 1:  URI (ICD-465.9) rest, fluids, Tylenol, Robitussin-DM if no better she will start a Z-Pak instructions provided in Spanish, see below  Problem # 2:  ? of ELEVATED BLOOD PRESSURE (ICD-796.2) no ambulatory BPs, BP today slightly  up I again encouraged her to check her blood pressure from time to time  Problem # 3:  needs a general checkup at her  convenience  Problem # 4:  OSTEOARTHRITIS (ICD-715.90)  likes a ortho referal for L knee pain--- will arrange  needs visit arranged w/ her daughter which will translate: [phone] Her updated medication list for this problem includes:    Vicodin Es 7.5-750 Mg Tabs (Hydrocodone-acetaminophen) .Marland Kitchen... 1 every 6 hours as needed pain  Orders: Orthopedic Referral (Ortho)  Complete Medication List: 1)  Lipitor 10 Mg Tabs (Atorvastatin calcium) .... 1/2 a day 2)  Mvi  3)  Calcium, D  4)  Vicodin Es 7.5-750 Mg Tabs (Hydrocodone-acetaminophen) .Marland Kitchen.. 1 every 6 hours as needed pain 5)  Zithromax Z-pak 250 Mg Tabs (Azithromycin) .... As directed  Patient Instructions: 1)  descanse y tome muchos liquidos 2)  tome tylenol si tiene fiebre o dolores 3)  tome robitussin DM 4 veces al dia para la tos 4)  si no esta mejor en 3 o 4 dias, toe "Zpack"  , siga las instrucciones de la caja 5)  llame si se siente peor 6)  cheque la presion arterial una vez por semana, llame si es mas de 150/85 7)  venga para un chequeo general a su conveniencia Prescriptions: LIPITOR 10 MG TABS (ATORVASTATIN CALCIUM) 1/2 a day  #30 x 6   Entered and Authorized by:   Sabriel Borromeo E. Jasemine Nawaz MD   Signed by:   Nolon Rod. Viki Carrera MD on 03/07/2010   Method used:   Print then Give to Patient   RxID:   [id number] [facility]  Z-PAK 250 MG TABS (AZITHROMYCIN) as directed  #1 x 0   Entered and Authorized by:   Nolon Rod. Kamden Reber MD   Signed by:   Nolon Rod. Coleton Woon MD on 03/07/2010   Method used:   Print then Give to Patient   RxID:   [id number]

## 2010-12-22 ENCOUNTER — Telehealth (INDEPENDENT_AMBULATORY_CARE_PROVIDER_SITE_OTHER): Payer: Self-pay | Admitting: *Deleted

## 2010-12-30 NOTE — Progress Notes (Signed)
Summary: rx  Phone Note Refill Request Call back at Home Phone [phone] Message from:  Patient  Refills Requested: Medication #1:  LIPITOR 10 MG TABS 1/2 a day. [facility] on [address]  Initial call taken by: Freddy Jaksch,  December 22, 2010 3:52 PM    Prescriptions: LIPITOR 10 MG TABS (ATORVASTATIN CALCIUM) 1/2 a day  #30 x 3   Entered by:   Army Fossa CMA   Authorized by:   Nolon Rod. Paz MD   Signed by:   Army Fossa CMA on 12/22/2010   Method used:   Electronically to        [facility] [address] (retail)       [phone]       [place], [place]  [address]       Ph: [phone]       Fax: [phone]   RxID:   [id number]

## 2011-03-20 NOTE — Assessment & Plan Note (Signed)
[facility]                         GASTROENTEROLOGY OFFICE NOTE   Regina Ray, Regina Ray                    MRN:          [id number]  DATE:02/10/2007                            DOB:          July 30, 1946    Mrs. Janowicz GI workup has been entirely negative except for an  ultrasound exam which shows mild fatty infiltrates to the liver. Her  liver function tests were normal.  A colonoscopy on January 10, 2007 was  unremarkable except for some small hyperplastic polyps.  Her endoscopy  also was unremarkable, except for some evidence of gastroparesis.  Attempts to treat her with metoclopramide 5 mg t.i.d. were unsuccessful.  The metoclopramide caused her nausea and she only took a few doses.   She returns today with her daughter and says she is asymptomatic.   Her only medication she is taking at this time is Paxil 10 mg a day.  She is not on Carafate or metoclopramide as mentioned.   Her laboratory data otherwise was entirely normal except for slightly  elevated sed rate of 34.  Thyroid functions, B12 and folate levels were  normal.  There is no evidence of cholelithiasis on ultrasound exam.   She weighs 173 pounds.  Her blood pressure is 120/72.  Pulse was 64 and  regular.  I did not perform complete physical exam.   ASSESSMENT:  Her workup has been negative except for some fatty  infiltration of her liver, which may have caused some of her vague right  upper quadrant discomfort.  I spoke with her daughter at length about a  slow progressive weight loss plan.   RECOMMENDATIONS:  I will be glad to see this patient again in the future  if indicated.  I have given her information concerning fatty liver and  its management.  The possibility of gastroparesis still exists, if she  in the future is symptomatic, I would recommend technetium gastric  emptying scan for clarification.     Vania Rea. Jarold Motto, MD, Caleen Essex, FAGA  Electronically  Signed    DRP/MedQ  DD: 02/10/2007  DT: 02/10/2007  Job #: [id number]   cc:   Charlesetta Shanks

## 2011-03-20 NOTE — Procedures (Signed)
[facility]                            ABDOMINAL ULTRASOUND REPORT   NAME:Regina Ray, Regina Ray                    MRN:          [id number]  DATE:06/18/2006                            DOB:          08/16/1946    ACCESSION NUMBER:  [id number]   ORDERING PHYSICIAN:  [name], MD   READING PHYSICIAN:  Malcom T. Russella Dar, MD, [facility]   PROCEDURE:  Multiplanar abdominal ultrasound imaging was performed in the  upright, supine, right and left lateral decubitus positions.   RESULTS:  Abdominal aorta with maximal diameter of 2 cm.  The abdominal  aorta is normal.  The IVC is patent.   The pancreas appears normal throughout the head, body and tail without  evidence of ductal dilatation, pancreatic masses, or peripancreatic  inflammation.   Gallbladder is normal, well distended, thin walled, wall thickness 2.9 mm,  with no pericholecystic fluid or intraluminal echogenic foci to suggest  gallstone disease.   The common bile duct is normal, measures 3.9 mm in maximal diameter without  evidence of intraluminal foci.   In the liver, there is a moderate increase in echodensity.  The remainder of  the liver exam is normal without evidence of parenchymal lesion, ductal  dilatation or vascular abnormality.   The right kidney has a 1.9-cm cyst; it measures 10.5 cm and is otherwise  normal.  The left kidney measures 8.8 cm and is normal.   The spleen measures 8.3 cm and is normal in size, without parenchymal  lesion.   IMPRESSION:  1. Moderately increased  hepatic echodensity.  2. A 1.9-cm right renal cyst.                                  Judie Petit T. Pleas Koch., MD, [facility]   MTS/MedQ  DD:  06/18/2006  DT:  06/19/2006  Job #:  [id number]   cc:   [name], MD

## 2011-03-20 NOTE — Assessment & Plan Note (Signed)
[facility]                         GASTROENTEROLOGY OFFICE NOTE   NAME:Regina Ray, Regina Ray                    MRN:          [id number]  DATE:01/04/2007                            DOB:          02/25/1946    REASON FOR CONSULTATION:  Regina Ray is a 65 year old Hispanic female  who speaks very little English.  Most of her history is obtained through  her daughter, Regina Ray.  She is an extremely pleasant 65 year old female  referred by Dr. Charlesetta Shanks, after seeing Dr. Celene Skeen on November 30, 2006, because of right upper quadrant pain which has been present since  August.  This pain is described as a dull constant aching sensation with  associated bloating, distention and some early satiety.  She had no  anorexia, weight loss, nausea, vomiting, change in bowel habits or true  dyspepsia or reflux symptoms.  She was placed on Carafate 1 gram after  meals and at bedtime.  She has had some improvement.  The pain does  occasionally radiate to her back, but there are no associated urologic  problems.  She underwent an upper abdominal ultrasound exam in our  ultrasound division, which was unremarkable except for some mild fatty  infiltration of the liver.  This was performed in August 2007, when the  patient was seeing Dr. [name].  I cannot see any recent  laboratory parameters.  The patient has never had barium studies of her  bowel, endoscopy or colonoscopy.  She has regular bowel movements.  Denies melena or hematochezia.   PAST MEDICAL HISTORY:  1. Hypercholesterolemia.  2. Mild essential hypertension.  3. History of chronic depression.  4. Recurrent kidney stones.  5. Allergic sinusitis.   MEDICATIONS:  1. Daily calcium.  She is not on estrogen at this time.  2. Carafate 1 gram q.i.d.  3. Paxil 10 mg q.d.   ALLERGIES:  She in the past has had anaphylaxis with PENICILLIN use.   FAMILY HISTORY:  Remarkable for breast cancer in the mother  but no known  gastrointestinal problems.   SOCIAL HISTORY:  The patient is married and lives with her husband and  daughter.  She does not smoke or use ethanol.  Her country of birth is  [place].   REVIEW OF SYSTEMS:  Noncontributory.   PHYSICAL EXAMINATION:  GENERAL:  She is a healthy-appearing, middle-aged  female, in no distress.  She appears her stated age.  VITAL SIGNS:  Weight 178 pounds.  Blood pressure 112/62, pulse 72 and  regular.  HEENT:  I could not appreciate stigmata of chronic liver disease or  thyromegaly.  CHEST:  Clear to percussion and auscultation anteriorly and posteriorly.  HEART:  She appeared to be in a regular rhythm without significant  murmurs, gallops or rubs.  ABDOMEN:  I could not appreciate any hepatomegaly or mass in the right  upper quadrant.  There was no splenomegaly or other abdominal masses or  tenderness.  Bowel sounds were normal.  RECTAL:  Exam was deferred.  NEUROLOGIC:  Mental status was clear.  EXTREMITIES:  Peripheral extremities were unremarkable.   ASSESSMENT:  Regina Ray  has atypical right upper quadrant pain.  I  cannot appreciate any hepatic swelling or enlargement.  Her response to  Carafate certainly suggests a possible Helicobacter pylori infection or  upper gastrointestinal-related condition such as atypical peptic ulcer  disease.  As mentioned previously, she had an ultrasound examination  which showed no evidence of cholelithiasis.  She did have a small right  incidental renal cyst noted.   RECOMMENDATIONS:  1. Check screening laboratory parameters including CBC, sedimentation      rate, metabolic profile, amylase and lipase.  2. Outpatient endoscopy and colonoscopy.  3. Consider an abdominal and pelvic CT scanning.  4. Continue other medications as per Dr. Celene Skeen.     Vania Rea. Jarold Motto, MD, Caleen Essex, FAGA  Electronically Signed    DRP/MedQ  DD: 01/04/2007  DT: 01/04/2007  Job #: [id number]   cc:   Charlesetta Shanks

## 2011-03-20 NOTE — H&P (Signed)
[facility]  Patient:    Regina Ray, Regina Ray                      MRN: [id number] Adm. Date:  05/11/00 Attending:  Gaetano Hawthorne. Lily Peer, M.D.                         History and Physical  CHIEF COMPLAINT               Intercavitary lesions.  HISTORY OF PRESENT ILLNESS:   The patient is a 65 year old gravida 6, para 4, AB 2 who initially was referred to our office from an urgent care center due to the fact that the patient was experiencing vasomotor symptoms.  Her ______ were found to be elevated and at one time in the past she had been seen by another practitioner in [place], [place], who had her on estrogen replacement therapy but due to the fact that she had irregular bleeding and felt bloated at times, she decided to discontinue it.  We had placed her on a cyclic Premarin/Provera such as 0.3 mg of Premarin daily from day one through 25 with the addition of Provera from day 16 through 25.  The patient had complained of abdominal bloating and abdominal and pelvic pains and ultrasound that had been done back on June 18, 1999, demonstrated she had numerous intramural myomas and an echo-free cyst on the right ovary and on the left ovary, respectively.  She was rescanned back in November of 2000, and was found to have multiple fibroids once again and essentially no change from previous scan.  Then, finally, in April of this year her ultrasound demonstrated uterus measuring 9.9 x 5.8 x 7.0 cm with several intramural and subserosal myomas, the largest one measuring 37 x 42 x 50 mm.  The right ovary had a right adnexal echo-free simple cyst measuring 22 x 16 x 27 which had decreased from previous scan in November of last year and her left ovary was normal.  The cul-de-sac was negative. She had a sonohysterogram due to the fact that at that time she had been complaining of episodes of amenorrhea, although she was on hormone replacement therapy and her  endometrial biopsy on February 24, 2000, demonstrated benign squamous mucosa and scant proliferative endometrium.  The sonohysterogram on February 23, 2000, demonstrated an anterior wall defect measuring 13 x 7 mm and a posterior wall defect measuring 8 mm was noted.  The area of calcification encroached the right anterior endometrial cavity.  The patient was suppose to have seen the gastroenterologist for her abdominal pain to rule out any GI problems and of this date it appears that she has not had that done. She did have a CA-125 done within a year and the result was normal.  She had a DHAS which was normal.  A 17 hydroxyprogesterone was normal and serum testosterone level was slightly elevated for postmenopausal patient.  The patient had been offered, if her symptoms were that severe, to proceed with a hysterectomy and bilateral salpingo-oophorectomy and decided to proceed with resectoscopic myomectomy/polypectomy.  PAST MEDICAL HISTORY:         The patient suffers from depression for which she takes 10 mg of Paxil daily.  She is also on diuretic consisting of triamterene, hydrochlorothiazide (37.5/2.5) q. daily along with her Premarin 0.3 mg p.o. q. daily from day one through 25 with the addition of Provera 10 mg from day  16 through 25.  Her Pap smear on February 25, 2000, was normal.  She also had a recent mammogram in June of this year which was also normal.  PAST SURGICAL HISTORY:        She did have some type of surgery which she thinks was for some ventricular septal defect when she was 66 years of age but no sequelae from that.  She has had two D&Cs.  ALLERGIES:                    PENICILLIN.  MEDICATIONS:                  She takes iron supplementation, multivitamins, as well as Celebrex for knee soreness experienced at times.  FAMILY HISTORY:               There is no family history of any malignancies reported.  SOCIAL HISTORY:               She denies any smoking or alcohol  consumption.  PHYSICAL EXAMINATION:  GENERAL APPEARANCE:           The patient is a well-developed, well-nourished female.  HEENT:                        Unremarkable.  NECK:                         Supple. The trachea is midline.  No carotid bruits, no thyromegaly.  LUNGS:                        Clear to auscultation without rhonchi or wheeze.  CARDIOVASCULAR:               Regular rate and rhythm with no murmurs or gallops.  BREASTS:                      Examination was carried out in the sitting and supine position.  Both breasts were symmetric in appearance.  No skin discoloration and no nipple inversion noted.  No palpable masses or tenderness.  There was no supraclavicular or axillary lymphadenopathy.  ABDOMEN:                      Soft and nontender without rebound or guarding.  PELVIC:                       Bartholin, urethra and Skenes glands within normal limits.  Vagina and cervix have no lesions or discharge.  Uterus is irregular shaped and upper limits of normal.  Adnexa has no masses or tenderness.  RECTAL:                       Unremarkable.  Hemoccult negative.  ASSESSMENT:                   A 65 year old gravida 6, para 4, AB 2 patient with intercavitary lesions consistent with possible endometrial polyp or myoma.  Endometrial biopsy recently was normal as well as Pap smear.  Patient also with intramural as well as subserosal myomas for which, due to her symptoms, the patient was given options of proceeding with abdominal hysterectomy and bilateral salpingo-oophorectomy or to proceed with resectoscopic myomectomy.  She has elected to proceed with a resectoscopic myomectomy.  The  risks and benefits, pros and cons of the procedure were discussed to include infection, bleeding, trauma to internal organs, fluid overload with extravasation of distending media and contributing to pulmonary edema.  Also in the event of uterine perforation, emergency  exploratory laparotomy may need to be performed in order to correct any internal trauma. Also in the event of infection, she will be given prophylaxis antibiotics and  in the event of uncontrollable hemorrhage and if she were to receive a blood transfusion, the risks are anaphylactic reaction, hepatitis and AIDS.  All of this was discussed with the patient. She was to have a laminaria placed intracervically in the office on May 10, 2000, to facilitate insertion and I offered a hysteroscope in the office the day of her surgery. She also is made aware that her abdominal bloating and discomfort may not be alleviated by the procedure and that she subsequently may end up with a hysterectomy down the line.  All of this was explained to the patient in Spanish.  All questions were answered and we will follow accordingly.  PLAN:                         The patient is scheduled for resectoscopic myomectomy/polypectomy on the morning of May 11, 2000, at [facility]. DD:  05/10/00 TD:  05/10/00 Job: [id number]

## 2011-03-20 NOTE — Op Note (Signed)
[facility]  Patient:    Regina Ray, Regina Ray                      MRN: [id number] Proc. Date: 05/11/00 Adm. Date:  [id number] Attending:  Tonye Royalty                           Operative Report  PREOPERATIVE DIAGNOSIS:       Intrauterine polyps versus myomas.  POSTOPERATIVE DIAGNOSIS:      Intrauterine polyps versus myomas.  OPERATION:                    Resectoscopic polypectomy/myomectomy.  SURGEON:                      Juan H. Lily Peer, M.D.  ASSISTANT:  ANESTHESIA:                   General.  ESTIMATED BLOOD LOSS:  INDICATIONS: Intercavitary uterine lesions in a 65 year old gravida 6, para 4, AB 2. Previous endometrial biopsy in the office was benign endometrium and ultrasound had demonstrated several intramural myomas and some impinging into the cavity versus endometrial polyps.  OPERATIVE FINDINGS:  The patient was noted to have submucous myoma in the lower uterine segment near the internal cervical os and also had small scattered polypoid-like lesions scattered throughout the intrauterine cavity. The endocervical canal was otherwise clear and smooth. Tubal ostia bilaterally were identified.  DESCRIPTION OF PROCEDURE: After the patient was adequately counseled, she was taken to the operating room where she underwent a successful general endotracheal anesthesia. She was placed in the lower lithotomy position. Pneumatic compression stockings were in place to prevent deep venous thrombosis. She did receive 1 g of Cefotan for prophylaxis as well. The vagina and perineum were prepped and draped in the usual sterile fashion. A red rubber catheter was inserted to evacuate bladder contents for approximately 50 cc. The uterus was found to be anteverted by previous examination. A weighted speculum and a retractor was placed into the vaginal vault for exposure. The single tooth tenaculum was placed in the anterior cervical lip for  manipulation of the cervix. The laminaria that was placed in the office the day before surgery was removed. The cervix required minimal dilatation with [facility] dilators. [facility] operating resectoscope with a 90 degree wire loop was inserted through the endocervical canal and into the intrauterine cavity. Then 3% Sorbitol was used as the distending medium and the Valulab electrical surgical generator with 80 watts in the cutting mode and 80 watts on the coagulation mode was utilized. Upon entrance into the endocervical canal, before entering the intrauterine cavity, in the lower uterine segment, there was a myomata that was blocking further advancement of the operative resectoscope and this required resection at this point. After the pieces were removed and passed off for histologic evaluation, the operative resectoscope was finally able to be introduced into the intrauterine cavity. Several polypoid like lesions were excised and passed off the operative field for histologic evaluation. For cauterization, the roller ball was used to cauterize the bloody endometrial cavity. After ascertaining adequate hemostasis, pictures are once again obtained. Pre and post-procedure pictures were obtained. The instruments were removed. Fluid deficit was 110 cc. IV fluids were 1000 cc of lactated ringers. The patient was awakened. The single tooth tenaculum was removed. She was transferred to the recovery room with stable vital signs. DD:  05/11/00 TD:  05/11/00 Job: [id number]

## 2011-04-06 ENCOUNTER — Telehealth: Payer: Self-pay | Admitting: Internal Medicine

## 2011-04-06 DIAGNOSIS — N6310 Unspecified lump in the right breast, unspecified quadrant: Secondary | ICD-10-CM

## 2011-04-06 NOTE — Telephone Encounter (Signed)
Letter from  [place] reviewed, needs a f/u [facility] Called to her home Phone [phone] The patient states that she simply couldn't go to her appointment for the  followup mammogram but she plans to reschedule. Reinforced  that is very important to f/u with them ,she verbalized understanding

## 2011-04-15 NOTE — Telephone Encounter (Signed)
Addended by: Doristine Devoid on: 04/15/2011 03:12 PM   Modules accepted: Orders

## 2011-06-09 ENCOUNTER — Ambulatory Visit: Payer: Self-pay | Admitting: Internal Medicine

## 2011-06-09 DIAGNOSIS — Z0289 Encounter for other administrative examinations: Secondary | ICD-10-CM

## 2011-06-24 ENCOUNTER — Encounter: Payer: Self-pay | Admitting: Family Medicine

## 2011-06-24 ENCOUNTER — Ambulatory Visit (INDEPENDENT_AMBULATORY_CARE_PROVIDER_SITE_OTHER): Payer: Medicare Other | Admitting: Family Medicine

## 2011-06-24 VITALS — BP 124/80 | HR 77 | Temp 97.7°F | Wt 174.4 lb

## 2011-06-24 DIAGNOSIS — R109 Unspecified abdominal pain: Secondary | ICD-10-CM

## 2011-06-24 DIAGNOSIS — R632 Polyphagia: Secondary | ICD-10-CM

## 2011-06-24 DIAGNOSIS — D234 Other benign neoplasm of skin of scalp and neck: Secondary | ICD-10-CM

## 2011-06-24 DIAGNOSIS — D224 Melanocytic nevi of scalp and neck: Secondary | ICD-10-CM | POA: Insufficient documentation

## 2011-06-24 DIAGNOSIS — R350 Frequency of micturition: Secondary | ICD-10-CM | POA: Insufficient documentation

## 2011-06-24 LAB — POCT URINALYSIS DIPSTICK
Bilirubin, UA: NEGATIVE
Glucose, UA: NEGATIVE
Nitrite, UA: NEGATIVE
pH, UA: 7.5

## 2011-06-24 MED ORDER — SULFAMETHOXAZOLE-TRIMETHOPRIM 800-160 MG PO TABS: 1.0000 | ORAL_TABLET | Freq: Two times a day (BID) | ORAL | Status: AC

## 2011-06-24 NOTE — Patient Instructions (Signed)
Take the Bactrim for the bladder infection Drink plenty of water We'll notify you of your lab results Someone will call you with your dermatology appt Call with any questions or concerns If your urine symptoms continue after the infection is treated- please follow up w/ Dr Sandrea Hammond in there!!!

## 2011-06-24 NOTE — Progress Notes (Signed)
  Subjective:    Patient ID: Regina Ray, female    DOB: 1946-01-15, 65 y.o.   MRN: [id number]  HPI abd pain- started 3 weeks ago, pain radiates to back.  Feels it is from her kidneys.  Denies burning w/ urination.  Feels bloated.  Pain on R side.  Increased urinary frequency, urgency.  Is eating more than usual- thinks there is something wrong w/ her thyroid b/c she is gaining weight.  Denies nausea and vomiting, diarrhea.  Denies GERD.    'spot on head'- has irregular mole on crown of head, is changing and painful.  Difficult hx to obtain- pt brought friend to translate for her.  Friend and pt are talking quite a bit but friend is giving me limited info.   Review of Systems For ROS see HPI     Objective:   Physical Exam  Vitals reviewed. Constitutional: She appears well-developed and well-nourished. No distress.  HENT:  Head: Normocephalic and atraumatic.  Neck: Neck supple. No thyromegaly present.  Abdominal: Soft. Bowel sounds are normal. She exhibits no distension and no mass. There is tenderness (RUQ/flank pain.  no CVA tenderness). There is no rebound and no guarding.  Musculoskeletal: She exhibits no edema.  Lymphadenopathy:    She has no cervical adenopathy.  Skin: Skin is warm and dry.       Has large area of hyperpigmentation and scaling on crown of head          Assessment & Plan:

## 2011-06-25 ENCOUNTER — Encounter: Payer: Self-pay | Admitting: Internal Medicine

## 2011-06-25 LAB — BASIC METABOLIC PANEL
CO2: 31 mEq/L (ref 19–32)
Chloride: 102 mEq/L (ref 96–112)
Creatinine, Ser: 0.7 mg/dL (ref 0.4–1.2)
Potassium: 5.1 mEq/L (ref 3.5–5.1)
Sodium: 139 mEq/L (ref 135–145)

## 2011-06-25 LAB — HEPATIC FUNCTION PANEL
ALT: 15 U/L (ref 0–35)
AST: 24 U/L (ref 0–37)
Alkaline Phosphatase: 77 U/L (ref 39–117)
Bilirubin, Direct: 0.1 mg/dL (ref 0.0–0.3)
Total Bilirubin: 0.7 mg/dL (ref 0.3–1.2)

## 2011-06-25 LAB — CBC WITH DIFFERENTIAL/PLATELET
Basophils Absolute: 0 10*3/uL (ref 0.0–0.1)
Eosinophils Absolute: 0.1 10*3/uL (ref 0.0–0.7)
Lymphocytes Relative: 50.4 % — ABNORMAL HIGH (ref 12.0–46.0)
MCHC: 32.7 g/dL (ref 30.0–36.0)
Monocytes Relative: 2.5 % — ABNORMAL LOW (ref 3.0–12.0)
Neutrophils Relative %: 44.3 % (ref 43.0–77.0)
Platelets: 234 10*3/uL (ref 150.0–400.0)
RDW: 14.8 % — ABNORMAL HIGH (ref 11.5–14.6)

## 2011-06-26 ENCOUNTER — Encounter: Payer: Self-pay | Admitting: Internal Medicine

## 2011-06-26 LAB — URINE CULTURE

## 2011-06-29 ENCOUNTER — Ambulatory Visit: Payer: Self-pay | Admitting: Internal Medicine

## 2011-06-30 NOTE — Assessment & Plan Note (Signed)
Pt feels this is due to abnormal thyroid.  Must also r/o DM given increased urinary frequency.  Check TSH and BMP.

## 2011-06-30 NOTE — Assessment & Plan Note (Signed)
Concerning given the irregular color and texture.  Pt reports recent change.  Refer to derm for evaluation and tx.

## 2011-06-30 NOTE — Assessment & Plan Note (Signed)
UA suspicious for infxn. Start bactrim.  If sxs persist after tx will need f/u w/ PCP.  Friend reports pt understands.

## 2011-06-30 NOTE — Assessment & Plan Note (Signed)
Non specific pain.  Pt in no acute distress.  Check labs to r/o infxn, metabolic abnormality.  May be due to UTI- tx this and if sxs persist pt to f/u w/ PCP.  Friend indicates pt understands.

## 2011-07-13 ENCOUNTER — Ambulatory Visit (INDEPENDENT_AMBULATORY_CARE_PROVIDER_SITE_OTHER): Payer: Medicare Other | Admitting: Family Medicine

## 2011-07-13 VITALS — BP 132/72 | Temp 97.7°F | Wt 174.0 lb

## 2011-07-13 DIAGNOSIS — H6690 Otitis media, unspecified, unspecified ear: Secondary | ICD-10-CM

## 2011-07-13 DIAGNOSIS — J40 Bronchitis, not specified as acute or chronic: Secondary | ICD-10-CM

## 2011-07-13 MED ORDER — CLARITHROMYCIN ER 500 MG PO TB24: 1000.0000 mg | ORAL_TABLET | Freq: Every day | ORAL | Status: AC

## 2011-07-13 MED ORDER — BENZONATATE 200 MG PO CAPS: 200.0000 mg | ORAL_CAPSULE | Freq: Three times a day (TID) | ORAL | Status: AC | PRN

## 2011-07-13 NOTE — Patient Instructions (Signed)
Take the Clarithromycin (2 tabs at the same time) for the ear infection, cough, and sinus pressure Use the Tessalon as needed for cough Drink plenty of fluids REST! Call with any questions or concerns Hang in there!!

## 2011-07-13 NOTE — Progress Notes (Signed)
  Subjective:    Patient ID: Regina Ray, female    DOB: Aug 27, 1946, 65 y.o.   MRN: [id number]  HPI Cough- pt reports hx of bronchitis.  Has been coughing x15 days.  Cough is productive of yellow sputum.  Subjective fever.  Ear pressure, + HA.  No facial pain.  + nasal congestion.  No known sick contacts.  Has taken OTC cough meds w/out relief.   Review of Systems For ROS see HPI     Objective:   Physical Exam  Vitals reviewed. Constitutional: She appears well-developed and well-nourished. No distress.  HENT:  Head: Normocephalic and atraumatic.       R TM erythematous, dull, poor landmarks. L TM WNL Mild nasal congestion Throat w/out erythema, edema, or exudate No TTP over sinuses  Eyes: Conjunctivae and EOM are normal. Pupils are equal, round, and reactive to light.  Neck: Normal range of motion. Neck supple.  Cardiovascular: Normal rate, regular rhythm, normal heart sounds and intact distal pulses.   No murmur heard. Pulmonary/Chest: Effort normal and breath sounds normal. No respiratory distress. She has no wheezes.       + hacking cough  Lymphadenopathy:    She has no cervical adenopathy.          Assessment & Plan:

## 2011-07-18 DIAGNOSIS — J40 Bronchitis, not specified as acute or chronic: Secondary | ICD-10-CM | POA: Insufficient documentation

## 2011-07-18 NOTE — Assessment & Plan Note (Signed)
Due to duration of cough and presence of OM, will start Biaxin.  Reviewed supportive care and red flags that should prompt return.  Pt expressed understanding and is in agreement w/ plan.

## 2011-07-18 NOTE — Assessment & Plan Note (Signed)
Pt w/ obvious OM.  Due to PCN allergy start macrolide.

## 2011-10-22 ENCOUNTER — Encounter: Payer: Self-pay | Admitting: Internal Medicine

## 2011-10-23 ENCOUNTER — Ambulatory Visit (INDEPENDENT_AMBULATORY_CARE_PROVIDER_SITE_OTHER): Payer: Medicare Other | Admitting: Internal Medicine

## 2011-10-23 VITALS — BP 144/72 | HR 61 | Temp 98.2°F | Ht 58.5 in | Wt 176.0 lb

## 2011-10-23 DIAGNOSIS — R519 Headache, unspecified: Secondary | ICD-10-CM | POA: Insufficient documentation

## 2011-10-23 DIAGNOSIS — R51 Headache: Secondary | ICD-10-CM | POA: Insufficient documentation

## 2011-10-23 DIAGNOSIS — E785 Hyperlipidemia, unspecified: Secondary | ICD-10-CM

## 2011-10-23 LAB — CBC WITH DIFFERENTIAL/PLATELET
Basophils Absolute: 0 10*3/uL (ref 0.0–0.1)
Eosinophils Absolute: 0.1 10*3/uL (ref 0.0–0.7)
Eosinophils Relative: 1 % (ref 0–5)
HCT: 42.9 % (ref 36.0–46.0)
Lymphocytes Relative: 23 % (ref 12–46)
MCH: 28.3 pg (ref 26.0–34.0)
MCV: 91.9 fL (ref 78.0–100.0)
Monocytes Absolute: 0.5 10*3/uL (ref 0.1–1.0)
RDW: 14.5 % (ref 11.5–15.5)
WBC: 9.1 10*3/uL (ref 4.0–10.5)

## 2011-10-23 NOTE — Progress Notes (Signed)
  Subjective:    Patient ID: Regina Ray, female    DOB: 1946-07-18, 65 y.o.   MRN: [id number]  HPI Routine office visit Request to change Lipitor to another medicine, states that she is loosing her hair and she is not feeling well. She could not elaborate any further what she means when she said she's not feeling well. She also reports episodes when she has headaches and dizziness some mornings, the headache is global with no associated symptoms. Eyes are slightly congested at the time  States that one time she checked her blood pressure and it was elevated consequently she likes BP medication. She could not tell me how high was the BP that one time she checked   Past Medical History: Anxiety, Depression Hyperlipidemia Allergic rhinitis ?claudication: ABIs normal 05/2009 Osteoarthritis fatty liver per ultrasound 2008 EGD showed gastroparesis 2008  Past Surgical History: Heart surgery @ age 28 (Intraauricular communication) Appendectomy  Social History: married, lives w/ husband 4 children does not drive tobacco--no ETOH--no     Review of Systems No diplopia, slurred speech or facial pyelitis. Headache and dizziness not associated with nausea or vomiting. She reports some myalgias, she believes due to Lipitor. Also some arthritis in the elbows and shoulders. Declined a flu shot    Objective:   Physical Exam  Constitutional: She is oriented to person, place, and time. She appears well-developed. No distress.  Neck: No thyromegaly present.  Cardiovascular: Normal rate, regular rhythm and normal heart sounds.   No murmur heard. Pulmonary/Chest: Effort normal and breath sounds normal. No respiratory distress. She has no wheezes. She has no rales.  Musculoskeletal: She exhibits no edema.  Neurological: She is alert and oriented to person, place, and time.       Gait, motor exam and speech are normal. Pupils equal and reactive, external ocular movements intact.   Skin:  She is not diaphoretic.  Psychiatric: She has a normal mood and affect. Her behavior is normal. Judgment and thought content normal.      Assessment & Plan:

## 2011-10-23 NOTE — Assessment & Plan Note (Signed)
Episodic headache and dizziness, patient complains is due to "elevated blood pressure" . Neurological exam normal Plan: CBC, sedimentation rate, BP monitoring. Instructions provided in Spanish. Patient well aware of red flag symptoms such as double vision, slurred speech or focal weakness

## 2011-10-23 NOTE — Patient Instructions (Addendum)
tomese la presion [employer] , llame si esta mas alta que 140/85 Tome tylenol para el dolor de cabeza deje de tomar lipitor regrese en 4 semanas para su examen general, en ayunas

## 2011-10-23 NOTE — Assessment & Plan Note (Signed)
Patient is convinced that Lipitor is causing her to "feel bad". Plan: Discontinue Lipitor, recheck a FLP in 6 weeks at the time of her physical exam.

## 2011-10-24 LAB — SEDIMENTATION RATE: Sed Rate: 14 mm/h (ref 0–22)

## 2011-10-25 ENCOUNTER — Encounter: Payer: Self-pay | Admitting: Internal Medicine

## 2011-10-28 ENCOUNTER — Encounter: Payer: Self-pay | Admitting: Internal Medicine

## 2011-10-28 ENCOUNTER — Telehealth: Payer: Self-pay | Admitting: Internal Medicine

## 2011-10-28 NOTE — Telephone Encounter (Signed)
Later from radiology received, patient is due for a mammogram and they have not been able to contact her. I called the patient,  has been out of the [place] but plans  to call them and make an appointment. Encouraged her to do so.

## 2011-11-04 ENCOUNTER — Encounter: Payer: Self-pay | Admitting: Internal Medicine

## 2012-04-02 ENCOUNTER — Emergency Department (INDEPENDENT_AMBULATORY_CARE_PROVIDER_SITE_OTHER): Payer: Self-pay

## 2012-04-02 ENCOUNTER — Emergency Department (INDEPENDENT_AMBULATORY_CARE_PROVIDER_SITE_OTHER)
Admission: EM | Admit: 2012-04-02 | Discharge: 2012-04-02 | Disposition: A | Discharge status: Home / Self Care | Payer: Self-pay | Source: Home / Self Care | Attending: Emergency Medicine | Admitting: Emergency Medicine

## 2012-04-02 ENCOUNTER — Encounter (HOSPITAL_COMMUNITY): Payer: Self-pay | Admitting: Emergency Medicine

## 2012-04-02 DIAGNOSIS — S139XXA Sprain of joints and ligaments of unspecified parts of neck, initial encounter: Secondary | ICD-10-CM

## 2012-04-02 DIAGNOSIS — S46019A Strain of muscle(s) and tendon(s) of the rotator cuff of unspecified shoulder, initial encounter: Secondary | ICD-10-CM

## 2012-04-02 DIAGNOSIS — S161XXA Strain of muscle, fascia and tendon at neck level, initial encounter: Secondary | ICD-10-CM

## 2012-04-02 DIAGNOSIS — S43429A Sprain of unspecified rotator cuff capsule, initial encounter: Secondary | ICD-10-CM

## 2012-04-02 MED ORDER — TRAMADOL HCL 50 MG PO TABS: 100.0000 mg | ORAL_TABLET | Freq: Three times a day (TID) | ORAL | Status: AC | PRN

## 2012-04-02 MED ORDER — MELOXICAM 7.5 MG PO TABS: 7.5000 mg | ORAL_TABLET | Freq: Every day | ORAL | Status: DC

## 2012-04-02 MED ORDER — METHOCARBAMOL 500 MG PO TABS: 500.0000 mg | ORAL_TABLET | Freq: Three times a day (TID) | ORAL | Status: AC
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
## 2012-04-02 NOTE — Discharge Instructions (Signed)
Distensin cervical  (Cervical Sprain)  Una distensin cervical es una lesin en el cuello en la que los ligamentos se estiran o se rompen. Los ligamentos son tejidos que sostienen los huesos del cuello en su Environmental consultant. Una distensin cervical puede ser desde muy leve a muy grave. La mayora mejora en 1 a 3 semanas, pero depende de la causa y la extensin de la lesin. En los casos graves pueden hacer que las vrtebras del cuello se vuelvan inestables. Esto puede causar un dao en la mdula espinal y puede dar Environmental consultant a graves problemas del [place]. Su mdico determinar si su su caso es leve o grave.  CAUSAS Las causas de una distensin cervical grave pueden ser:   [place] Mo[place] prctica de ftbol americano, rugby, [place], hockey, automovilismo, gimnasia, buceo, artes [employer] y boxeo.   Colisiones en vehculos de motor.   Lesiones de Presenter, broadcasting. Esto significa que el cuello se fuerza hacia atrs y [place].   Cadas.  La causa de las distensiones cervicales leves pueden ser:   Adoptar posiciones incmodas, como sostener el telfono entre la oreja y [place].   Sentarse en una silla que no ofrece el soporte adecuado.   Trabajar en una mesa de computadora mal diseada.   Las [employer] que requieren mirar hacia arriba o hacia abajo durante largos perodos.  SNTOMAS  Dolor, sensibilidad, rigidez, o sensacin de ardor en la parte anterior, posterior o lateral del cuello. Este malestar puede desarrollarse inmediatamente despus de la lesin o puede desarrollarse lentamente y no empezar hasta 24 horas o ms despus de la lesin.   Dolor o sensibilidad que se siente directamente en la parte media posterior del cuello.   Dolor en el hombro o la zona superior de la espalda.   Capacidad limitada para mover el cuello.   Dolor de [place].   Mareos.   Debilidad, entumecimiento u hormigueo en las manos o los brazos.   Espasmos musculares.   Dificultad para tragar o masticar.    Sensibilidad e hinchazn en el cuello.  DIAGNSTICO La mayora de las veces, el mdico puede diagnosticar este problema mediante la historia clnica y un examen fsico. Su mdico le preguntar acerca de problemas conocidos,como artritis en el cuello o una lesin previa en el cuello. Podrn tomarle radiografas para determinar si hay otros problemas, como enfermedades en los huesos del cuello. Sin embargo, en general las radiografas no revelan una distensin cervical en su totalidad. Puede ser necesario realizar otras pruebas, como una tomografa computada o la resonancia magntica.  TRATAMIENTO El tratamiento depende de la gravedad de la distensin. Las distensiones leves se pueden tratar con reposo, manteniendo el cuello en su lugar (inmobilizacin) y usando medicamentos para Chief Technology Officer. Las distensiones cervicales graves necesitan inmovilizacin inmediata y Cheral Marker con un ortopedista o neurocirujano. Hay varias opciones de tratamiento disponibles para calmar el dolor, los espasmos musculares y otros sntomas. Su mdico puede recetar:   Medicamentos como calmantes para Chief Technology Officer, anestsicos o relajantes musculares.   Fisioterapia. Esto puede incluir ejercicios de elongacin, fortalecimiento y Fish farm manager de Armed forces logistics/support/administrative officer. Los ejercicios y [place] mejor postura pueden ayudar a estabilizar el cuello, fortalecer los msculos y Automotive engineer que los sntomas regresen.   El uso de un collar durante cortos perodos de [place]. Generalmente estos collares se usan para aumentar la comodidad. Sin embargo, ciertos collares pueden usarse ??para proteger el cuello y evitar un mayor deterioro de una distensin cervical grave.  CUIDADOS EN EL HOGAR  Aplique hielo sobre  la zona lesionada.   Ponga el hielo en una bolsa plstica.   Colquese una toalla entre la piel y la bolsa de hielo.   Deje el hielo durante 15 a 20 minutos, 3 a 4 veces por da.   Slo tome medicamentos de venta libre o prescriptos para Manufacturing systems engineer, las molestias o bajar la fiebre segn las indicaciones de su mdico.   Cumpla con todas las visitas de control, segn le indique su mdico.   Cumpla con todas las sesiones de fisioterapia, segn le indique su mdico.   Si le indican el uso de un collar, selo segn las indicaciones del mdico.   No conduzca vehculos mientras [place] el collar.   Haga los ajustes necesarios en su lugar de trabajo para favorecer una buena postura.   Evite las posiciones y actividades que [employer] sntomas.   Haga precalentamiento y elongue antes de comenzar una actividad para Physiological scientist.  SOLICITE ATENCIN MDICA SI:   El dolor no cesa con Engineer, maintenance (IT).   Siente que no puede dejar de Associate Professor como se le indic.   No puede mejorar el nivel de actividad segn lo planeado/esperado.  SOLICITE ATENCIN MDICA DE INMEDIATO SI:   Tiene algn sangrado, molestias en el estmago o signos de reaccin alrgica por los medicamentos.   Los sntomas empeoran.   Le aparecen nuevos e inexplicables sntomas.   Siente debilidad, hormigueo, adormecimiento o parlisis en alguna parte del cuerpo.  ASEGRESE DE QUE:   Comprende esas instrucciones para el alta mdica.   Controlar su enfermedad.   Solicitar ayuda de inmediato si no mejora o si empeora.  Document Released: 01/15/2009 Document Revised: 10/08/2011 [facility] Patient Information 2012 [place], [place].Lesin del Engineer, drilling Soil scientist Injury) El manguito rotador es un conjunto de msculos y tendones (los msculos supraespinoso, infraespinoso, subscapular, redondo Adult nurse y sus tendones correspondientes) que componen la unidad estabilizadora del hombro. Este conjunto de msculos y tendones sostienen la cabeza (esfera) del hmero (hueso de la parte superior del brazo) en la fosa (cavidad) de la escpula (omplato). Los traumatismos en esta unidad estabilizadora generalmente se producen por la prctica de deportes o  por actividades que hacen que el brazo se mueva repetidamente sobre la cabeza del hmero. Algunos ejemplos son: lanzamiento, levantar peso, nadar, deportes con raqueta o traumatismos, como caerse [employer]. La irritacin crnica de esta unidad puede causar inflamacin, bursitis y finalmente daos en los tendones hasta el punto de la ruptura. Un traumatismo agudo (sbito) del [employer] rotador puede dar como resultado una ruptura parcial o completa. Si la ruptura es [place], Paramedic. Neomia Dear ruptura pequea podr tratarse de Gus [place] conservadora con inmovilizacin temporaria y reposo. Podr necesitar fisioterapia. INSTRUCCIONES PARA EL CUIDADO DOMICILIARIO  Aplique hielo sobre la lesin durante 15 a 20 minutos 3 a 4 veces por [employer] primeros [address]  [address]. Ponga el hielo en una bolsa plstica y coloque una toalla entre la bolsa y la piel.   Si le han inmovilizado el brazo (con un cabestrillo y tirantes), no los retire Sales executive tiempo que se lo indique su mdico lo vea un profesional en la visita de seguimiento. Si necesita quitarlos, mueva el brazo lo menos posible.   Podr dormir sobre varias almohadas para disminuir la hinchazn y Chief Technology Officer.   Utilice los medicamentos de venta libre o de prescripcin para Chief Technology Officer, Environmental health practitioner o la [place], segn se lo indique el profesional que lo asiste.   Realice  ejercicios simples apretando una pelota blanda de goma para disminuir la hinchazn de la [place].  SOLICITE ATENCIN MDICA SI:  El dolor en el hombro [place], o siente un nuevo dolor en el brazo, en la mano o en los dedos.   La mano o los dedos estn ms fros que la [place].  SOLICITE ATENCIN MDICA DE INMEDIATO SI:  El brazo, la mano o los dedos estn adormecidos o siente hormigueos.   El brazo, la mano o los dedos estn hinchados, le duelen o se ven blancos o [place].  Document Released: 07/29/2005 Document Revised: 10/08/2011 [facility] Patient Information 2012 [place], [place].
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
## 2012-04-19 ENCOUNTER — Ambulatory Visit (INDEPENDENT_AMBULATORY_CARE_PROVIDER_SITE_OTHER): Payer: Medicare Other | Admitting: Internal Medicine

## 2012-04-19 VITALS — BP 140/84 | HR 59 | Temp 98.2°F | Wt 174.0 lb

## 2012-04-19 DIAGNOSIS — J309 Allergic rhinitis, unspecified: Secondary | ICD-10-CM

## 2012-04-19 DIAGNOSIS — I10 Essential (primary) hypertension: Secondary | ICD-10-CM

## 2012-04-19 MED ORDER — PREDNISONE 10 MG PO TABS: ORAL_TABLET | ORAL | Status: DC

## 2012-04-19 MED ORDER — AZITHROMYCIN 250 MG PO TABS: ORAL_TABLET | ORAL | Status: AC

## 2012-04-19 MED ORDER — FLUTICASONE PROPIONATE 50 MCG/ACT NA SUSP: 2.0000 | Freq: Every day | NASAL | Status: DC

## 2012-04-19 NOTE — Progress Notes (Signed)
  Subjective:    Patient ID: Regina Ray, female    DOB: 13-Sep-1946, 66 y.o.   MRN: [id number]  HPI Acute visit 4 months history several symptoms, on and off, patient believes sx are due to allergies. Occasional cough, sneezing, watery nasal discharge and sinus pain. Occasional chest congestion. Also continue with mild global headaches on and off. Dizziness only with cough. Also, went to urgent care few days ago, BP was elevated and she was prescribed a BP medication, name?  Past Medical History: Anxiety, Depression Hyperlipidemia Allergic rhinitis ?claudication: ABIs normal 05/2009 Osteoarthritis fatty liver per ultrasound 2008 EGD showed gastroparesis 2008  Past Surgical History: Heart surgery @ age 73 (Intraauricular communication) Appendectomy  Social History: married, lives w/ husband 4 children does not drive tobacco--no ETOH--no     Review of Systems No fever or chills Shortness of breath only with cough. Denies itchy eyes but has some itchy throat.     Objective:   Physical Exam  General -- alert, well-developed, and overweight appearing. No apparent distress.  HEENT -- TMs normal, throat w/o redness, face symmetric and slt tender to palpation @ all sinuses, nose slt congested   Lungs -- normal respiratory effort, no intercostal retractions, no accessory muscle use, and normal breath sounds.   Heart-- normal rate, regular rhythm, no murmur, and no gallop.   Neurologic-- alert & oriented X3 , speech, gait and motor  Intact. Psych-- Cognition and judgment appear intact. Alert and cooperative with normal attention span and concentration.  not anxious appearing and not depressed appearing.       Assessment & Plan:

## 2012-04-19 NOTE — Assessment & Plan Note (Signed)
Dx w/ hypertension at a urgent care, was prescribed a medication, name?. Recommend to return to the office for a physical exam, bring all the medications that she is on.

## 2012-04-19 NOTE — Assessment & Plan Note (Addendum)
Symptoms are consistent with allergies (including the headaches  ) thus will start treatment, if not better she will need further evaluation including probably a head CT  Plan: Prednisone, Claritin, Zithromax, Mucinex and Flonase.

## 2012-04-19 NOTE — Patient Instructions (Addendum)
Tome la prednisona , zithromax tal como esta prescrito. Use el spray nasal (2 sprays a cada lado de la nariz) todos los [employer] claritin 10 mg 1 tableta diaria x 2 semanas , no necesita receta ----- regrese para un chequeo completo ------ Please schedule a yearly checkup and the patient's convenience

## 2012-04-20 ENCOUNTER — Encounter: Payer: Self-pay | Admitting: Internal Medicine

## 2012-05-20 ENCOUNTER — Encounter: Payer: Self-pay | Admitting: Internal Medicine

## 2012-05-20 ENCOUNTER — Ambulatory Visit (INDEPENDENT_AMBULATORY_CARE_PROVIDER_SITE_OTHER): Payer: Medicare Other | Admitting: Internal Medicine

## 2012-05-20 VITALS — BP 118/72 | HR 55 | Temp 97.4°F | Wt 173.0 lb

## 2012-05-20 DIAGNOSIS — I10 Essential (primary) hypertension: Secondary | ICD-10-CM

## 2012-05-20 DIAGNOSIS — J309 Allergic rhinitis, unspecified: Secondary | ICD-10-CM

## 2012-05-20 DIAGNOSIS — E785 Hyperlipidemia, unspecified: Secondary | ICD-10-CM

## 2012-05-20 DIAGNOSIS — R059 Cough, unspecified: Secondary | ICD-10-CM

## 2012-05-20 DIAGNOSIS — R05 Cough: Secondary | ICD-10-CM

## 2012-05-20 LAB — LIPID PANEL
Cholesterol: 231 mg/dL — ABNORMAL HIGH (ref 0–200)
HDL: 43.7 mg/dL (ref >39.00)
Total CHOL/HDL Ratio: 5
VLDL: 37.6 mg/dL (ref 0.0–40.0)

## 2012-05-20 MED ORDER — FEXOFENADINE HCL 180 MG PO TABS: 180.0000 mg | ORAL_TABLET | Freq: Every day | ORAL | Status: DC

## 2012-05-20 NOTE — Progress Notes (Signed)
  Subjective:    Patient ID: Regina Ray, female    DOB: August 09, 1946, 66 y.o.   MRN: [id number]  HPI Here to discuss allergies and  to check her cholesterol. She was recently seen with respiratory symptoms, was treated with a Z-Pak, Flonase, prednisone. She did not get better. Continue with cough on and off, itchy eyes and nose occasionally. She also had a headache but that  improved. Denies GERD symptoms Denies fever. Reports nasal discharge, it is always clear.  Past Medical History: Anxiety, Depression Hyperlipidemia Allergic rhinitis ?claudication: ABIs normal 05/2009 Osteoarthritis fatty liver per ultrasound 2008 EGD showed gastroparesis 2008  Past Surgical History: Heart surgery @ age 32 (Intraauricular communication) Appendectomy  Social History: married, lives w/ husband, 4 children does not drive tobacco--no ETOH--no     Review of Systems History of present illness    Objective:   Physical Exam General -- alert, well-developed, no apparent distress.  Neck --no LADs HEENT -- TMs normal, throat w/o redness, face symmetric and not tender to palpation, nose congested  Lungs -- normal respiratory effort, no intercostal retractions, no accessory muscle use, and normal breath sounds.   Heart-- normal rate, regular rhythm, no murmur, and no gallop.   Psych-- Cognition and judgment appear intact. Alert and cooperative with normal attention span and concentration.  not anxious appearing and not depressed appearing.       Assessment & Plan:

## 2012-05-20 NOTE — Assessment & Plan Note (Signed)
Today she showed me today medication they gave her for "hypertension", it was an antihistaminic. The patient  apparently doesn't have hypertension.

## 2012-05-20 NOTE — Assessment & Plan Note (Addendum)
Continue with sx , see history of present illness, she took prednisone, Flonase a Z-Pak with really no relief of her symptoms. takes loratadine without much help Headache is resolved. Plan: Chest x-ray due to persistent cough Allegra 180 mg Dymista samples Empiric Prilosec The patient request more antibiotics, I don't see the need for antibiotics at this time If she's not improving, I'll refer her to Dr. Beatriz Stallion

## 2012-05-20 NOTE — Assessment & Plan Note (Signed)
Likes to have her FLP rechecked. Labs

## 2012-05-20 NOTE — Patient Instructions (Addendum)
Dymista 2 sprays a cada lado de la nariz 2 veces al dia Allegra 1 tableta cada noche prilosec 20 mg (se compra sin receta) 1 al dia antes del desayuno tomese un XR del pecho llame si no mejora en 2 semanas  -------- Please get your x-ray at the other [place]  office located at: [address] [place], across from [facility].  Please go to the basement, this is a walk-in facility, they are open from 8:30 to 5:30 PM. Phone number [phone].

## 2012-05-24 ENCOUNTER — Ambulatory Visit (INDEPENDENT_AMBULATORY_CARE_PROVIDER_SITE_OTHER)
Admission: RE | Admit: 2012-05-24 | Discharge: 2012-05-24 | Disposition: A | Discharge status: Home / Self Care | Payer: Medicare Other | Source: Ambulatory Visit | Attending: Internal Medicine | Admitting: Internal Medicine

## 2012-05-24 DIAGNOSIS — R05 Cough: Secondary | ICD-10-CM

## 2012-05-24 DIAGNOSIS — R059 Cough, unspecified: Secondary | ICD-10-CM

## 2012-05-26 ENCOUNTER — Encounter: Payer: Self-pay | Admitting: Internal Medicine

## 2012-05-27 ENCOUNTER — Other Ambulatory Visit: Payer: Self-pay | Admitting: Internal Medicine

## 2012-05-27 ENCOUNTER — Ambulatory Visit: Payer: Self-pay | Admitting: Internal Medicine

## 2012-05-27 NOTE — Telephone Encounter (Signed)
Looks like med was d/c due to side effects. OK to refill?

## 2012-07-09 IMAGING — CR DG CHEST 2V
2 series · 2 of 2 positions shown · non-contrast
Comparison: Prior digitized exam 09/09/2005

CLINICAL DATA: Cough, shortness of breath and back pain.
Nonsmoker.  History of bronchitis.

CHEST - 2 VIEW

[view not recorded (1 of 2)]
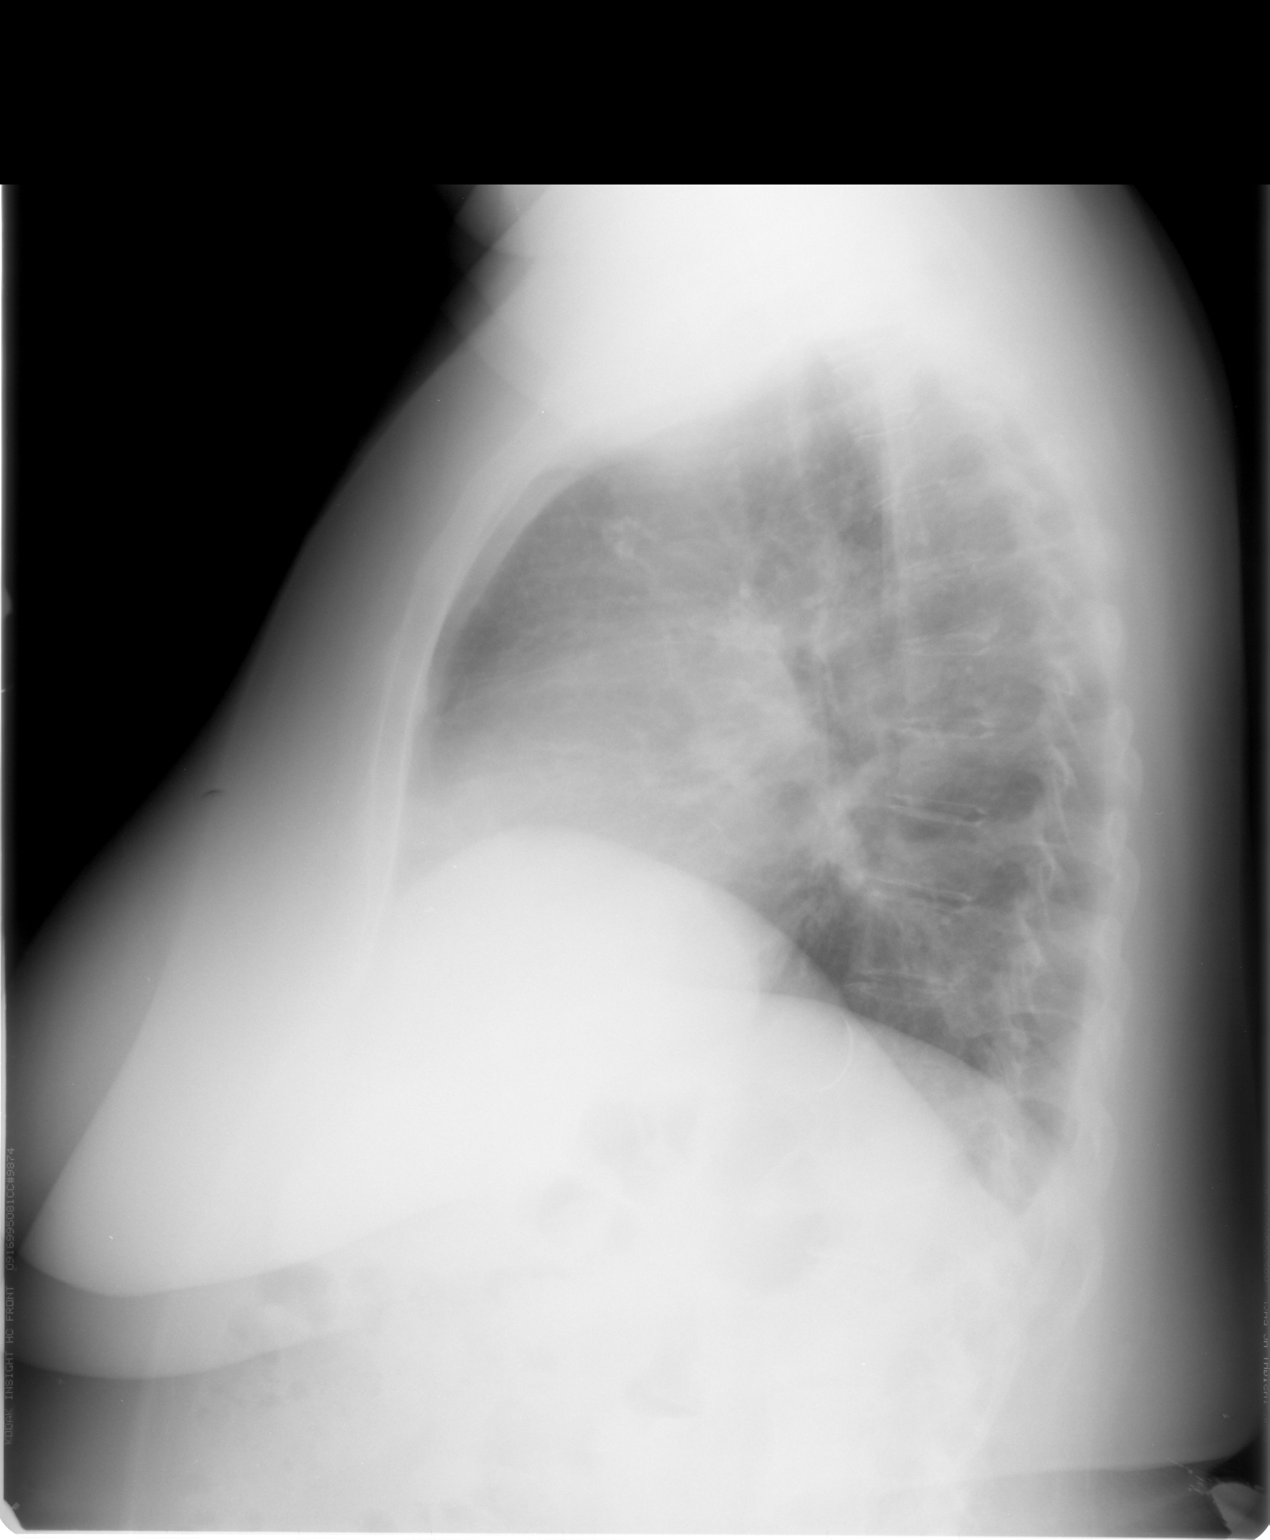

[view not recorded (2 of 2)]
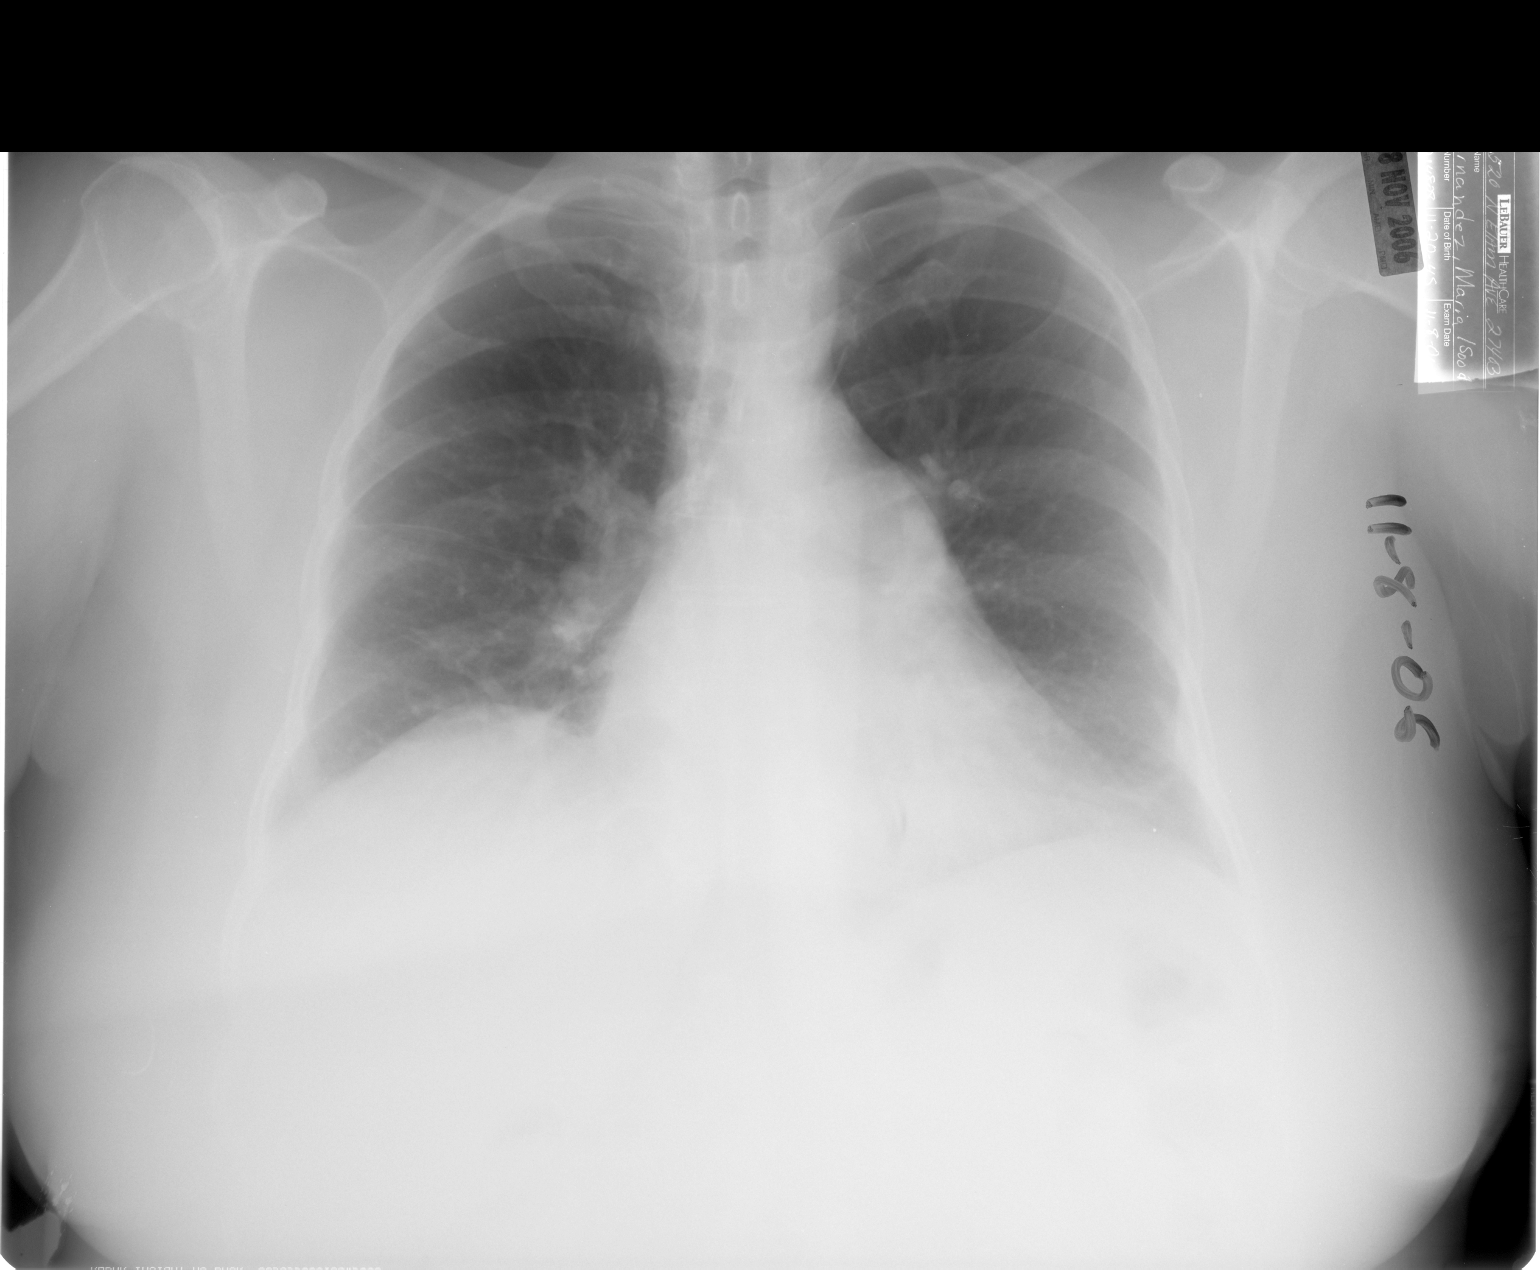

[2 of 2 positions shown; findings below may reference images not displayed]

FINDINGS: Heart is mildly enlarged.  There is elevation of the
right hemidiaphragm. There are perihilar bronchitic changes.  No
focal consolidations or pleural effusions are identified.
Overlying the soft tissues of the right posterolateral chest wall,
there is a curvilinear metallic density, 2.5 cm in length,
resembling a suture needle.  Correlation with prior surgical
history is recommended.
IMPRESSION: 1.  Cardiomegaly without pulmonary edema.
2.  Bronchitic changes.  The three stable elevated right
hemidiaphragm.
4.  Question of retained suture needle right post lateral chest
wall.

Findings were telephoned to Dr. Ponnaiah at the time of interpretation

## 2013-02-03 ENCOUNTER — Ambulatory Visit (INDEPENDENT_AMBULATORY_CARE_PROVIDER_SITE_OTHER): Payer: Medicare Other | Admitting: Internal Medicine

## 2013-02-03 VITALS — BP 128/76 | HR 67 | Temp 97.7°F | Wt 168.0 lb

## 2013-02-03 DIAGNOSIS — L659 Nonscarring hair loss, unspecified: Secondary | ICD-10-CM

## 2013-02-03 DIAGNOSIS — J309 Allergic rhinitis, unspecified: Secondary | ICD-10-CM

## 2013-02-03 DIAGNOSIS — D224 Melanocytic nevi of scalp and neck: Secondary | ICD-10-CM

## 2013-02-03 LAB — TSH: TSH: 1.804 u[IU]/mL (ref 0.350–4.500)

## 2013-02-03 MED ORDER — AZELASTINE-FLUTICASONE 137-50 MCG/ACT NA SUSP: 2.0000 | Freq: Every day | NASAL | Status: DC

## 2013-02-03 NOTE — Assessment & Plan Note (Signed)
Reports saw derm , got cryotherapy

## 2013-02-03 NOTE — Assessment & Plan Note (Signed)
Check a TSH RPR

## 2013-02-03 NOTE — Patient Instructions (Addendum)
Is very important that you come back for a physical at your earliest convenience (fasting); please make an appointment. ES MUY  IMPORTANTE QUE REGRESE EN AYUNAS PARA UN EXAMEN FISICO COMPLETO, POR FAVOR SAQUE UNA CITA

## 2013-02-03 NOTE — Progress Notes (Signed)
  Subjective:    Patient ID: Regina Ray, female    DOB: 04-03-1946, 67 y.o.   MRN: [id number]  HPI Acute visit Since the last time she was here, she continue with allergies: Sneezing, clear nasal discharge, cough. Also concerned because she has lost a significant amount of hair in the last few months; request a TSH   Past Medical History: Anxiety, Depression Hyperlipidemia Allergic rhinitis ?claudication: ABIs normal 05/2009 Osteoarthritis fatty liver per ultrasound 2008 EGD showed gastroparesis 2008  Past Surgical History: Heart surgery @ age 51 (Intraauricular communication) Appendectomy  Social History: married, lives w/ husband, 4 children does not drive tobacco--no ETOH--no    Review of Systems Denies fever or chills. Occasional "chest congestion" unclear if she's trying to describe wheezing per se. Admits to some tiredness, no weight gain she actually has lost some weight.    Objective:   Physical Exam General -- alert, well-developed, no apparent distress .   HEENT -- TMs normal, throat w/o redness, face symmetric and not tender to palpation, nose is slightly congested, eyes are slightly watery but not read Lungs -- normal respiratory effort, no intercostal retractions, no accessory muscle use, and normal breath sounds.   Heart-- normal rate, regular rhythm, no murmur, and no gallop.    Neurologic-- alert & oriented X3 and strength normal in all extremities. Skin--has thin hair and some alopecia in a global distribution. Psych-- Cognition and judgment appear intact. Alert and cooperative with normal attention span and concentration.  not anxious appearing and not depressed appearing.       Assessment & Plan:

## 2013-02-03 NOTE — Assessment & Plan Note (Signed)
Ongoing issues Refer to allergy Add dymista, samples and a Rx provided occ "chest congestion", ?asthma; exam today is benign but may need PFTs

## 2013-02-04 ENCOUNTER — Encounter: Payer: Self-pay | Admitting: Internal Medicine

## 2013-02-08 ENCOUNTER — Encounter: Payer: Self-pay | Admitting: *Deleted

## 2013-03-17 ENCOUNTER — Ambulatory Visit (INDEPENDENT_AMBULATORY_CARE_PROVIDER_SITE_OTHER): Payer: Medicare Other | Admitting: Family Medicine

## 2013-03-17 DIAGNOSIS — H6690 Otitis media, unspecified, unspecified ear: Secondary | ICD-10-CM

## 2013-03-17 DIAGNOSIS — J309 Allergic rhinitis, unspecified: Secondary | ICD-10-CM

## 2013-03-17 DIAGNOSIS — J302 Other seasonal allergic rhinitis: Secondary | ICD-10-CM

## 2013-03-17 MED ORDER — AZITHROMYCIN 250 MG PO TABS: ORAL_TABLET | ORAL | Status: DC

## 2013-03-17 MED ORDER — PREDNISONE 20 MG PO TABS: 20.0000 mg | ORAL_TABLET | Freq: Two times a day (BID) | ORAL | Status: DC

## 2013-03-17 NOTE — Progress Notes (Signed)
  Subjective:    Patient ID: Regina Ray, female    DOB: 17-Nov-1945, 67 y.o.   MRN: [id number]  HPI Pt is here today concerning allergies and ear pain. No fever or nausea. It bothers her throat and makes it feel scratchy. She is concerned now about her ear. She has been having pain for about a week now.  Ear pain both ears is worsening.   PMHx;  From [place], h/o allergies, no h/o ear infections  Review of Systems     Objective:   Physical Exam No acute distress. Seen with daughter helps translate. HEENT: Unremarkable except for scarred TMs bilaterally with opacities and loss of light reflex Neck: Supple no adenopathy Chest: Clear Heart: Regular no murmur Skin: No rashes noted       Assessment & Plan:  Otitis media bilaterally with allergies  Plan: Prednisone to 20 mg twice a day x5 days with food, Z-Pak  Signed, Elvina Sidle, M.D.

## 2013-03-17 NOTE — Patient Instructions (Signed)
Otitis media en el adulto  (Otitis Media, Adult)  Usted tiene una infección en el oído medio. Este tipo de infección afecta el espacio que se encuentra detrás del tímpano. Este trastorno se denomina "otitis media" Puede ocurrir como consecuencia de un resfrío común. Lo origina un germen que comienza a multiplicarse en ese espacio. Usted podrá sentir que las ganglios del cuello que están del lado de la infección se hinchan.  INSTRUCCIONES PARA EL CUIDADO DOMICILIARIO  · Tome los medicamentos tal como se le ha indicado hasta que se terminen, aun si se siente bien durante los primeros días.  · Utilice los medicamentos de venta libre o de prescripción para el dolor, el malestar o la fiebre, según se lo indique el profesional que lo asiste.  · Ocasionalmente, puede utilizar un descongestivo nasal, un par de veces por día para mejorar las molestias y ayudar a que las trompas de [place] drenen mejor.  Concurra a este centro para un seguimiento con el profesional que lo asiste luego de 10 a 14 días, o cuando se le indique, para asegurarse que la infección ha desaparecido completamente.  SOLICITE ATENCIÓN MÉDICA DE INMEDIATO SI:  · No obtiene mejoría en 2 ó 3 días.  · El dolor no se alivia con los medicamentos.  · Siente que empeora en vez de mejorar.  · Si no puede tomar los medicamentos según se le ha indicado.  · Presenta hinchazón, enrojecimiento o dolor alrededor del oído o rigidez en el cuello.  ESTÉ SEGURO QUE:  · Comprende las instrucciones para el alta médica.  · Controlará su enfermedad.  · Solicitará atención médica de inmediato según las indicaciones.  Document Released: 07/29/2005 Document Revised: 01/11/2012  [employer]® Patient Information ©2013 [employer].

## 2014-08-31 ENCOUNTER — Ambulatory Visit (INDEPENDENT_AMBULATORY_CARE_PROVIDER_SITE_OTHER): Payer: Commercial Managed Care - HMO | Admitting: Internal Medicine

## 2014-08-31 ENCOUNTER — Encounter: Payer: Self-pay | Admitting: Internal Medicine

## 2014-08-31 VITALS — BP 156/91 | HR 65 | Temp 98.1°F | Wt 173.5 lb

## 2014-08-31 DIAGNOSIS — F32A Depression, unspecified: Secondary | ICD-10-CM

## 2014-08-31 DIAGNOSIS — Z23 Encounter for immunization: Secondary | ICD-10-CM

## 2014-08-31 DIAGNOSIS — F419 Anxiety disorder, unspecified: Secondary | ICD-10-CM

## 2014-08-31 DIAGNOSIS — F329 Major depressive disorder, single episode, unspecified: Secondary | ICD-10-CM

## 2014-08-31 DIAGNOSIS — F418 Other specified anxiety disorders: Secondary | ICD-10-CM

## 2014-08-31 DIAGNOSIS — E785 Hyperlipidemia, unspecified: Secondary | ICD-10-CM

## 2014-08-31 DIAGNOSIS — I10 Essential (primary) hypertension: Secondary | ICD-10-CM

## 2014-08-31 DIAGNOSIS — Z0000 Encounter for general adult medical examination without abnormal findings: Secondary | ICD-10-CM | POA: Insufficient documentation

## 2014-08-31 MED ORDER — ZOSTER VACCINE LIVE 19400 UNT/0.65ML ~~LOC~~ SOLR: 0.6500 mL | Freq: Once | SUBCUTANEOUS | Status: DC

## 2014-08-31 NOTE — Assessment & Plan Note (Signed)
Td-- today Had a flu shot pnm 23-- today Had a prevnar zostavax rx provided   Cscope--hyperplastic polyp 01/10/2007,  No recent mammogram, schedule one  Has not seen gynecology and while, referred to Dr. Toney Rakes Diet and exercise discussed

## 2014-08-31 NOTE — Progress Notes (Signed)
Pre visit review using our clinic review tool, if applicable. No additional management support is needed unless otherwise documented below in the visit note. 

## 2014-08-31 NOTE — Progress Notes (Signed)
Subjective:    Patient ID: Regina Ray, female    DOB: 11/13/1945, 68 y.o.   MRN: [id number]  DOS:  08/31/2014 Type of visit - description :  Here for Medicare AWV:  1. Risk factors based on Past M, S, F history: reviewed 2. Physical Activities:  occ takes walk, active at home 3. Depression/mood: neg screening  4. Hearing:  No problemss noted or reported  5. ADL's:  Independent, does not drive by choice 6. Fall Risk: neg screen , prevention discussed  7. home Safety: does feel safe at home  8. Height, weight, & visual acuity: see VS, no recent eye check, rec to schedule a visit  9. Counseling: provided 10. Labs ordered based on risk factors: if needed  11. Referral Coordination: if needed 12. Care Plan, see assessment and plan  13. Cognitive Assessment: motor-skills and cognition normal for age 12. Team care update , sees cards for BP, dr Terrence Dupont  In addition, today we discussed the following: Hypertension, recently she was noted to have elevated BP, has seen the cardiologist. Marena Chancy if she is taking lisinopril, dose? High cholesterol, on Lipitor, labs Anxiety depression, not a major issue at this time   ROS Denies chest pain or difficulty breathing No nausea, vomiting, diarrhea. No dysuria, gross hematuria. Occasional dizziness.        Past Medical History  Diagnosis Date  . Anxiety and depression   . Hyperlipidemia   . Allergy   . DJD (degenerative joint disease)   . Fatty liver     per [place] 2008  . Gastroparesis     per EGD 2008  . HTN (hypertension)     Past Surgical History  Procedure Laterality Date  . Appendectomy    . 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 Abis normal  05-2009    for question of claudication    History   Social History  . Marital Status: Married    Spouse Name: N/A    Number of Children: 4  . Years of Education: N/A   Occupational History  . stay home    Social History Main Topics  .  Smoking status: Never Smoker   . Smokeless tobacco: Never Used  . Alcohol Use: No  . Drug Use: No  . Sexual Activity: Not on file   Other Topics Concern  . Not on file   Social History Narrative   From [place]   Does not drive     Family History  Problem Relation Age of Onset  . Colon cancer Neg Hx   . Breast cancer Mother     dx age 23  . CAD Other     GM       Medication List       This list is accurate as of: 08/31/14 11:59 PM.  Always use your most recent med list.               atorvastatin 40 MG tablet  Commonly known as:  LIPITOR  Take 40 mg by mouth daily.     CALCIUM & VIT D3 BONE HEALTH PO  Take by mouth.     fexofenadine 180 MG tablet  Commonly known as:  ALLEGRA  Take 1 tablet (180 mg total) by mouth daily.     fish oil-omega-3 fatty acids 1000 MG capsule  Take 2 g by mouth daily.     LISINOPRIL PO  Take 1 tablet by mouth  daily. Unknown mgs     zoster vaccine live (PF) 19400 UNT/0.65ML injection  Commonly known as:  ZOSTAVAX  Inject 19,400 Units into the skin once.           Objective:   Physical Exam BP 156/91  Pulse 65  Temp(Src) 98.1 F (36.7 C) (Oral)  Wt 173 lb 8 oz (78.699 kg)  SpO2 97% General -- alert, well-developed, NAD.  Neck --no thyromegaly , normal carotid pulse  HEENT-- Not pale.   Lungs -- normal respiratory effort, no intercostal retractions, no accessory muscle use, and normal breath sounds.  Heart-- normal rate, regular rhythm, no murmur.  Abdomen-- Not distended, good bowel sounds,soft, non-tender. No rebound or rigidity.   Extremities-- no pretibial edema bilaterally  Neurologic--  alert & oriented X3. Speech normal, gait appropriate for age, strength symmetric and appropriate for age.  Psych-- Cognition and judgment appear intact. Cooperative with normal attention span and concentration. No anxious or depressed appearing.        Assessment & Plan:

## 2014-08-31 NOTE — Patient Instructions (Signed)
Stop by the front desk and schedule labs to be done within few days (fasting)  Check the  blood pressure 2 or 3 times a week Be sure your blood pressure is between  145/85  and 110/65.  if it is consistently higher or lower, let me know     Please come back to the office in 8 weeks  for a routine check up  of the appointment within few days. If you don't hear from [place] please call the office   Fall Prevention and Home Safety Falls cause injuries and can affect all age groups. It is possible to use preventive measures to significantly decrease the likelihood of falls. There are many simple measures which can make your home safer and prevent falls. OUTDOORS  Repair cracks and edges of walkways and driveways.  Remove high doorway thresholds.  Trim shrubbery on the main path into your home.  Have good outside lighting.  Clear walkways of tools, rocks, debris, and clutter.  Check that handrails are not broken and are securely fastened. Both sides of steps should have handrails.  Have leaves, snow, and ice cleared regularly.  Use sand or salt on walkways during winter months.  In the garage, clean up grease or oil spills. BATHROOM  Install night lights.  Install grab bars by the toilet and in the tub and shower.  Use non-skid mats or decals in the tub or shower.  Place a plastic non-slip stool in the shower to sit on, if needed.  Keep floors dry and clean up all water on the floor immediately.  Remove soap buildup in the tub or shower on a regular basis.  Secure bath mats with non-slip, double-sided rug tape.  Remove throw rugs and tripping hazards from the floors. BEDROOMS  Install night lights.  Make sure a bedside light is easy to reach.  Do not use oversized bedding.  Keep a telephone by your bedside.  Have a firm chair with side arms to use for getting dressed.  Remove throw rugs and tripping hazards from the floor. KITCHEN  Keep handles on pots and pans  turned toward the center of the stove. Use back burners when possible.  Clean up spills quickly and allow time for drying.  Avoid walking on wet floors.  Avoid hot utensils and knives.  Position shelves so they are not too high or low.  Place commonly used objects within easy reach.  If necessary, use a sturdy step stool with a grab bar when reaching.  Keep electrical cables out of the way.  Do not use floor polish or wax that makes floors slippery. If you must use wax, use non-skid floor wax.  Remove throw rugs and tripping hazards from the floor. STAIRWAYS  Never leave objects on stairs.  Place handrails on both sides of stairways and use them. Fix any loose handrails. Make sure handrails on both sides of the stairways are as long as the stairs.  Check carpeting to make sure it is firmly attached along stairs. Make repairs to worn or loose carpet promptly.  Avoid placing throw rugs at the top or bottom of stairways, or properly secure the rug with carpet tape to prevent slippage. Get rid of throw rugs, if possible.  Have an electrician put in a light switch at the top and bottom of the stairs. OTHER FALL PREVENTION TIPS  Wear low-heel or rubber-soled shoes that are supportive and fit well. Wear closed toe shoes.  When using a stepladder, make sure  it is fully opened and both spreaders are firmly locked. Do not climb a closed stepladder.  Add color or contrast paint or tape to grab bars and handrails in your home. Place contrasting color strips on first and last steps.  Learn and use mobility aids as needed. Install an electrical emergency response system.  Turn on lights to avoid dark areas. Replace light bulbs that burn out immediately. Get light switches that glow.  Arrange furniture to create clear pathways. Keep furniture in the same place.  Firmly attach carpet with non-skid or double-sided tape.  Eliminate uneven floor surfaces.  Select a carpet pattern that  does not visually hide the edge of steps.  Be aware of all pets. OTHER HOME SAFETY TIPS  Set the water temperature for 120 F (48.8 C).  Keep emergency numbers on or near the telephone.  Keep smoke detectors on every level of the home and near sleeping areas. Document Released: 10/09/2002 Document Revised: 04/19/2012 Document Reviewed: 01/08/2012 [facility] Patient Information 2015 [place], [place]. This information is not intended to replace advice given to you by your health care provider. Make sure you discuss any questions you have with your health care provider.    Preventive Care for Adults     Ages 55 years and over  Blood pressure check.** / Every 1 to 2 years.  Lipid and cholesterol check.** / Every 5 years beginning at age 67 years.  Lung cancer screening. / Every year if you are aged 62-80 years and have a 30-pack-year history of smoking and currently smoke or have quit within the past 15 years. Yearly screening is stopped once you have quit smoking for at least 15 years or develop a health problem that would prevent you from having lung cancer treatment.  Clinical breast exam.** / Every year after age 27 years.  BRCA-related cancer risk assessment.** / For women who have family members with a BRCA-related cancer (breast, ovarian, tubal, or peritoneal cancers).  Mammogram.** / Every year beginning at age 35 years and continuing for as long as you are in good health. Consult with your health care provider.  Pap test.** / Every 3 years starting at age 79 years through age 46 or 64 years with 3 consecutive normal Pap tests. Testing can be stopped between 65 and 70 years with 3 consecutive normal Pap tests and no abnormal Pap or HPV tests in the past 10 years.  HPV screening.** / Every 3 years from ages 84 years through ages 56 or 42 years with a history of 3 consecutive normal Pap tests. Testing can be stopped between 65 and 70 years with 3 consecutive normal Pap tests and  no abnormal Pap or HPV tests in the past 10 years.  Fecal occult blood test (FOBT) of stool. / Every year beginning at age 65 years and continuing until age 27 years. You may not need to do this test if you get a colonoscopy every 10 years.  Flexible sigmoidoscopy or colonoscopy.** / Every 5 years for a flexible sigmoidoscopy or every 10 years for a colonoscopy beginning at age 75 years and continuing until age 47 years.  Hepatitis C blood test.** / For all people born from [birth year] through 1965 and any individual with known risks for hepatitis C.  Osteoporosis screening.** / A one-time screening for women ages 3 years and over and women at risk for fractures or osteoporosis.  Skin self-exam. / Monthly.  Influenza vaccine. / Every year.  Tetanus, diphtheria, and acellular pertussis (Tdap/Td)  vaccine.** / 1 dose of Td every 10 years.  Varicella vaccine.** / Consult your health care provider.  Zoster vaccine.** / 1 dose for adults aged 28 years or older.  Pneumococcal 13-valent conjugate (PCV13) vaccine.** / Consult your health care provider.  Pneumococcal polysaccharide (PPSV23) vaccine.** / 1 dose for all adults aged 91 years and older.  Meningococcal vaccine.** / Consult your health care provider.  Hepatitis A vaccine.** / Consult your health care provider.  Hepatitis B vaccine.** / Consult your health care provider.  Haemophilus influenzae type b (Hib) vaccine.** / Consult your health care provider. ** Family history and personal history of risk and conditions may change your health care provider's recommendations. Document Released: 12/15/2001 Document Revised: 03/05/2014 Document Reviewed: 03/16/2011 [facility] Patient Information 2015 [place], [place]. This information is not intended to replace advice given to you by your health care provider. Make sure you discuss any questions you have with your health care provider.

## 2014-08-31 NOTE — Assessment & Plan Note (Signed)
On Lipitor, will check labs

## 2014-08-31 NOTE — Assessment & Plan Note (Signed)
Has been seen Dr. Terrence Dupont, cardiology, the last time she was there BP was okay. BP today slightly elevated. Patient is not sure if she is taking lisinopril. Plan: Labs Check a monitor BPs Patient will call and let us know what medication for BP she is on

## 2014-09-01 ENCOUNTER — Encounter: Payer: Self-pay | Admitting: Internal Medicine

## 2014-09-01 NOTE — Assessment & Plan Note (Signed)
Not an issue at this point. Not taking any medication

## 2014-09-03 ENCOUNTER — Other Ambulatory Visit (INDEPENDENT_AMBULATORY_CARE_PROVIDER_SITE_OTHER): Payer: Commercial Managed Care - HMO

## 2014-09-03 DIAGNOSIS — I10 Essential (primary) hypertension: Secondary | ICD-10-CM

## 2014-09-03 DIAGNOSIS — E785 Hyperlipidemia, unspecified: Secondary | ICD-10-CM

## 2014-09-03 LAB — COMPREHENSIVE METABOLIC PANEL
ALT: 21 U/L (ref 0–35)
AST: 22 U/L (ref 0–37)
Albumin: 3.5 g/dL (ref 3.5–5.2)
Alkaline Phosphatase: 68 U/L (ref 39–117)
BILIRUBIN TOTAL: 1 mg/dL (ref 0.2–1.2)
BUN: 16 mg/dL (ref 6–23)
CO2: 31 mEq/L (ref 19–32)
Calcium: 9.3 mg/dL (ref 8.4–10.5)
Chloride: 105 mEq/L (ref 96–112)
Creatinine, Ser: 0.7 mg/dL (ref 0.4–1.2)
GFR: 86.51 mL/min (ref >60.00)
GLUCOSE: 100 mg/dL — AB (ref 70–99)
Potassium: 4.9 mEq/L (ref 3.5–5.1)
SODIUM: 141 meq/L (ref 135–145)
TOTAL PROTEIN: 7 g/dL (ref 6.0–8.3)

## 2014-09-03 LAB — CBC WITH DIFFERENTIAL/PLATELET
Basophils Absolute: 0 10*3/uL (ref 0.0–0.1)
Basophils Relative: 0.5 % (ref 0.0–3.0)
EOS PCT: 1.8 % (ref 0.0–5.0)
Eosinophils Absolute: 0.1 10*3/uL (ref 0.0–0.7)
HEMATOCRIT: 38.3 % (ref 36.0–46.0)
Hemoglobin: 12.4 g/dL (ref 12.0–15.0)
LYMPHS ABS: 1.5 10*3/uL (ref 0.7–4.0)
Lymphocytes Relative: 25.8 % (ref 12.0–46.0)
MCHC: 32.4 g/dL (ref 30.0–36.0)
MCV: 90.7 fl (ref 78.0–100.0)
MONO ABS: 0.3 10*3/uL (ref 0.1–1.0)
Monocytes Relative: 4.8 % (ref 3.0–12.0)
Neutro Abs: 4 10*3/uL (ref 1.4–7.7)
Neutrophils Relative %: 67.1 % (ref 43.0–77.0)
PLATELETS: 261 10*3/uL (ref 150.0–400.0)
RBC: 4.22 Mil/uL (ref 3.87–5.11)
RDW: 15.3 % (ref 11.5–15.5)
WBC: 6 10*3/uL (ref 4.0–10.5)

## 2014-09-03 LAB — LIPID PANEL
Cholesterol: 129 mg/dL (ref 0–200)
HDL: 37.1 mg/dL — ABNORMAL LOW (ref >39.00)
LDL CALC: 65 mg/dL (ref 0–99)
NONHDL: 91.9
Total CHOL/HDL Ratio: 3
Triglycerides: 134 mg/dL (ref 0.0–149.0)
VLDL: 26.8 mg/dL (ref 0.0–40.0)

## 2014-09-03 LAB — TSH: TSH: 1.8 u[IU]/mL (ref 0.35–4.50)

## 2014-11-09 ENCOUNTER — Ambulatory Visit (INDEPENDENT_AMBULATORY_CARE_PROVIDER_SITE_OTHER): Payer: Medicare HMO | Admitting: Internal Medicine

## 2014-11-09 ENCOUNTER — Encounter: Payer: Self-pay | Admitting: Internal Medicine

## 2014-11-09 VITALS — BP 143/81 | HR 64 | Temp 98.2°F | Ht <= 58 in | Wt 175.4 lb

## 2014-11-09 DIAGNOSIS — I10 Essential (primary) hypertension: Secondary | ICD-10-CM

## 2014-11-09 DIAGNOSIS — R5382 Chronic fatigue, unspecified: Secondary | ICD-10-CM

## 2014-11-09 LAB — BASIC METABOLIC PANEL
BUN: 17 mg/dL (ref 6–23)
CHLORIDE: 103 meq/L (ref 96–112)
CO2: 27 meq/L (ref 19–32)
Calcium: 9.6 mg/dL (ref 8.4–10.5)
Creat: 0.68 mg/dL (ref 0.50–1.10)
GLUCOSE: 108 mg/dL — AB (ref 70–99)
POTASSIUM: 5 meq/L (ref 3.5–5.3)
Sodium: 140 mEq/L (ref 135–145)

## 2014-11-09 NOTE — Assessment & Plan Note (Signed)
Since the last visit, BP was elevated, he saw Dr. Terrence Dupont, was a started on losartan, feeling well, BP today is very good. Other medications include amlodipine 5 mg Plan: Continue with present care, check a BMP

## 2014-11-09 NOTE — Progress Notes (Signed)
Pre visit review using our clinic review tool, if applicable. No additional management support is needed unless otherwise documented below in the visit note. 

## 2014-11-09 NOTE — Patient Instructions (Addendum)
Get your blood work before you leave   Check the  blood pressure 2 or 3 times a  week  Be sure your blood pressure is between 110/65 and  145/85.  if it is consistently higher or lower, let me know      Please come back to the office in 6 months  for a routine check up

## 2014-11-09 NOTE — Progress Notes (Signed)
Subjective:    Patient ID: OKLA QAZI, female    DOB: [birth year], 69 y.o.   MRN: [id number]  DOS:  11/09/2014 Type of visit - description : Routine checkup Interval history: Since the last time she was here, her BP was elevated, she saw cardiology Dr. Terrence Dupont, lisinopril was switch to losartan. No recent ambulatory BPs, she feels well.    ROS Denies chest pain, difficulty breathing. No lower extremity edema When asked, admits to occasional fatigue, on and off, occasionally takes a nap. Denies feeling sleepy all day long. Symptoms are stable over the last few years  Past Medical History  Diagnosis Date  . Anxiety and depression   . Hyperlipidemia   . Allergy   . DJD (degenerative joint disease)   . Fatty liver     per [place] 2008  . Gastroparesis     per EGD 2008  . HTN (hypertension)     Past Surgical History  Procedure Laterality Date  . Appendectomy    . Cardiac surgery      @ age 63 Manufacturing systems engineer communication)  . Abis normal  05-2009    for question of claudication    History   Social History  . Marital Status: Married    Spouse Name: N/A    Number of Children: 4  . Years of Education: N/A   Occupational History  . stay home    Social History Main Topics  . Smoking status: Never Smoker   . Smokeless tobacco: Never Used  . Alcohol Use: No  . Drug Use: No  . Sexual Activity: Not on file   Other Topics Concern  . Not on file   Social History Narrative   From [place]   Does not drive        Medication List       This list is accurate as of: 11/09/14 11:59 PM.  Always use your most recent med list.               amLODipine 5 MG tablet  Commonly known as:  NORVASC  Take 5 mg by mouth daily.     atorvastatin 40 MG tablet  Commonly known as:  LIPITOR  Take 40 mg by mouth daily.     CALCIUM & VIT D3 BONE HEALTH PO  Take by mouth.     fexofenadine 180 MG tablet  Commonly known as:  ALLEGRA  Take 1 tablet (180 mg total) by mouth daily.      fish oil-omega-3 fatty acids 1000 MG capsule  Take 2 g by mouth daily.     losartan 100 MG tablet  Commonly known as:  COZAAR  Take 100 mg by mouth daily.     MELATONIN PO  Take by mouth as needed.     zoster vaccine live (PF) 19400 UNT/0.65ML injection  Commonly known as:  ZOSTAVAX  Inject 19,400 Units into the skin once.           Objective:   Physical Exam BP 143/81 mmHg  Pulse 64  Temp(Src) 98.2 F (36.8 C) (Oral)  Ht 4\' 10"  (1.473 m)  Wt 175 lb 6 oz (79.55 kg)  BMI 36.66 kg/m2  SpO2 95% General -- alert, well-developed, NAD.   Lungs -- normal respiratory effort, no intercostal retractions, no accessory muscle use, and normal breath sounds.  Heart-- normal rate, regular rhythm, no murmur.   Extremities-- no pretibial edema bilaterally  Neurologic--  alert & oriented X3. Speech normal, gait appropriate for  age, strength symmetric and appropriate for age.    Psych-- Cognition and judgment appear intact. Cooperative with normal attention span and concentration. No anxious or depressed appearing.        Assessment & Plan:

## 2014-11-10 DIAGNOSIS — R5383 Other fatigue: Secondary | ICD-10-CM | POA: Insufficient documentation

## 2014-11-10 NOTE — Assessment & Plan Note (Signed)
ROS was positive for fatigue, occasionally needs a nap but denies feeling sleepy throughout the day. Admits to some snoring. Recommend to monitor the issue at this time, will consider further evaluation if she gets worse, sleep apnea?

## 2014-12-28 ENCOUNTER — Encounter: Payer: Self-pay | Admitting: Internal Medicine

## 2014-12-28 ENCOUNTER — Ambulatory Visit (INDEPENDENT_AMBULATORY_CARE_PROVIDER_SITE_OTHER): Payer: Medicare HMO | Admitting: Internal Medicine

## 2014-12-28 VITALS — BP 147/87 | HR 72 | Temp 98.2°F | Ht <= 58 in | Wt 179.1 lb

## 2014-12-28 DIAGNOSIS — Z0000 Encounter for general adult medical examination without abnormal findings: Secondary | ICD-10-CM

## 2014-12-28 DIAGNOSIS — J209 Acute bronchitis, unspecified: Secondary | ICD-10-CM

## 2014-12-28 MED ORDER — AZITHROMYCIN 250 MG PO TABS: ORAL_TABLET | ORAL | Status: DC

## 2014-12-28 MED ORDER — HYDROCODONE-HOMATROPINE 5-1.5 MG/5ML PO SYRP: 5.0000 mL | ORAL_SOLUTION | Freq: Every evening | ORAL | Status: DC | PRN

## 2014-12-28 NOTE — Progress Notes (Signed)
Pre visit review using our clinic review tool, if applicable. No additional management support is needed unless otherwise documented below in the visit note. 

## 2014-12-28 NOTE — Progress Notes (Signed)
Subjective:    Patient ID: Regina Ray, female    DOB: 04-May-1946, 69 y.o.   MRN: [id number]  DOS:  12/28/2014 Type of visit - description : ACUTE Interval history: Symptoms started about 8 days ago with cough and sputum production, initially sputum was greenish and she saw small amount of red blood on it. Currently, sputum is either yellow or  clear, continue with cough, has a hard time sleeping.  Review of Systems Denies fever chills, some fatigue. No headache, mild sinus congestion but no nasal discharge No chest pain or difficulty breathing. Admits to upper back pain she thinks related to cough. Initially had a question of wheezing, no wheezing in the last few days  Past Medical History  Diagnosis Date  . Anxiety and depression   . Hyperlipidemia   . Allergy   . DJD (degenerative joint disease)   . Fatty liver     per [place] 2008  . Gastroparesis     per EGD 2008  . HTN (hypertension)     Past Surgical History  Procedure Laterality Date  . Appendectomy    . Cardiac surgery      @ age 75 Manufacturing systems engineer communication)  . Abis normal  05-2009    for question of claudication    History   Social History  . Marital Status: Married    Spouse Name: N/A  . Number of Children: 4  . Years of Education: N/A   Occupational History  . stay home    Social History Main Topics  . Smoking status: Never Smoker   . Smokeless tobacco: Never Used  . Alcohol Use: No  . Drug Use: No  . Sexual Activity: Not on file   Other Topics Concern  . Not on file   Social History Narrative   From [place]   Does not drive        Medication List       This list is accurate as of: 12/28/14 11:59 PM.  Always use your most recent med list.               amLODipine 5 MG tablet  Commonly known as:  NORVASC  Take 5 mg by mouth daily.     atorvastatin 40 MG tablet  Commonly known as:  LIPITOR  Take 40 mg by mouth daily.     azithromycin 250 MG tablet  Commonly known as:   ZITHROMAX Z-PAK  2 tabs a day the first day, then 1 tab a day x 4 days     CALCIUM & VIT D3 BONE HEALTH PO  Take by mouth.     fexofenadine 180 MG tablet  Commonly known as:  ALLEGRA  Take 1 tablet (180 mg total) by mouth daily.     fish oil-omega-3 fatty acids 1000 MG capsule  Take 2 g by mouth daily.     HYDROcodone-homatropine 5-1.5 MG/5ML syrup  Commonly known as:  HYCODAN  Take 5 mLs by mouth at bedtime as needed for cough.     losartan 100 MG tablet  Commonly known as:  COZAAR  Take 100 mg by mouth daily.     MELATONIN PO  Take by mouth as needed.           Objective:   Physical Exam BP 147/87 mmHg  Pulse 72  Temp(Src) 98.2 F (36.8 C) (Oral)  Ht 4\' 10"  (1.473 m)  Wt 179 lb 2 oz (81.251 kg)  BMI 37.45 kg/m2  SpO2 93%  General:   Well developed, well nourished . NAD.  HEENT:  Normocephalic . Face symmetric, atraumatic Tympanic membranes normal, nose not congested, sinuses no TTP. Throat symmetric, no red Lungs:  CTA B, no crackles or wheezing. Frequent cough noted Normal respiratory effort, no intercostal retractions, no accessory muscle use. Heart: RRR,  no murmur.  Muscle skeletal: no pretibial edema bilaterally  Skin: Not pale. Not jaundice Neurologic:  alert & oriented X3.  Speech normal, gait appropriate for age and unassisted Psych--  Cognition and judgment appear intact.  Cooperative with normal attention span and concentration.  Behavior appropriate. No anxious or depressed appearing.       Assessment & Plan:    Bronchitis, Symptoms consistent with bronchitis, initially she saw some blood in the sputum, that has resolved. Plan: Fluids, Mucinex DM, Z-Pak, hydrocodone, drowsiness discussed. Strongly encouraged to call us back if she is not improving particularly if she has more hemoptysis;  instructions discussed in Spanish  Needs a referral to gynecology, likes to see a female MD

## 2014-12-28 NOTE — Patient Instructions (Signed)
TOME MUCHOS LIQUIDOS PARA LA TOS: MUCINEX DM 2 VECES AL DIA  SI LA TOS CONTINUA, EN LA NOCHE PUEDE TOMAR HYDROCODONE, LE AYUDARA CON A LA TOS PERO PUEDE CAUSAR SUEN~O TOME EL ANTIBIOTICO (ZITHROMAX) LLAME SI NO MEJORA EN LOS PROXIMOS DIAS

## 2014-12-31 ENCOUNTER — Encounter: Payer: Self-pay | Admitting: Internal Medicine

## 2015-02-06 ENCOUNTER — Other Ambulatory Visit: Payer: Self-pay

## 2015-02-26 ENCOUNTER — Other Ambulatory Visit: Payer: Self-pay | Admitting: Obstetrics and Gynecology

## 2015-02-26 LAB — HM PAP SMEAR

## 2015-02-27 LAB — CYTOLOGY - PAP

## 2015-05-10 ENCOUNTER — Ambulatory Visit: Payer: Medicare HMO | Admitting: Internal Medicine

## 2015-05-14 ENCOUNTER — Encounter: Payer: Self-pay | Admitting: Internal Medicine

## 2015-05-14 ENCOUNTER — Telehealth: Payer: Self-pay | Admitting: Internal Medicine

## 2015-05-14 NOTE — Telephone Encounter (Signed)
Pt was no show 05/10/15 2:15pm, follow up 15 appt, pt has not rescheduled, mailing letter, charge for no show?

## 2015-05-15 NOTE — Telephone Encounter (Signed)
If this is not the first no-show, yes send a charge

## 2015-07-17 ENCOUNTER — Ambulatory Visit: Payer: Medicare HMO

## 2015-07-17 DIAGNOSIS — Z23 Encounter for immunization: Secondary | ICD-10-CM

## 2015-07-17 MED ORDER — INFLUENZA VAC SPLIT QUAD 0.5 ML IM SUSY
0.5000 mL | PREFILLED_SYRINGE | INTRAMUSCULAR | Status: AC
  Administered 2015-07-17: 0.5 mL via INTRAMUSCULAR

## 2015-10-11 ENCOUNTER — Ambulatory Visit (INDEPENDENT_AMBULATORY_CARE_PROVIDER_SITE_OTHER): Payer: Medicare HMO | Admitting: Internal Medicine

## 2015-10-11 ENCOUNTER — Encounter: Payer: Self-pay | Admitting: Internal Medicine

## 2015-10-11 VITALS — BP 122/60 | HR 71 | Temp 97.6°F | Ht <= 58 in | Wt 176.1 lb

## 2015-10-11 DIAGNOSIS — R0609 Other forms of dyspnea: Secondary | ICD-10-CM

## 2015-10-11 DIAGNOSIS — Z0000 Encounter for general adult medical examination without abnormal findings: Secondary | ICD-10-CM

## 2015-10-11 DIAGNOSIS — Z1231 Encounter for screening mammogram for malignant neoplasm of breast: Secondary | ICD-10-CM

## 2015-10-11 DIAGNOSIS — E785 Hyperlipidemia, unspecified: Secondary | ICD-10-CM

## 2015-10-11 DIAGNOSIS — R51 Headache: Secondary | ICD-10-CM | POA: Diagnosis not present

## 2015-10-11 DIAGNOSIS — I10 Essential (primary) hypertension: Secondary | ICD-10-CM | POA: Diagnosis not present

## 2015-10-11 DIAGNOSIS — Z780 Asymptomatic menopausal state: Secondary | ICD-10-CM

## 2015-10-11 DIAGNOSIS — R0600 Dyspnea, unspecified: Secondary | ICD-10-CM

## 2015-10-11 DIAGNOSIS — Z1159 Encounter for screening for other viral diseases: Secondary | ICD-10-CM

## 2015-10-11 DIAGNOSIS — Z09 Encounter for follow-up examination after completed treatment for conditions other than malignant neoplasm: Secondary | ICD-10-CM

## 2015-10-11 DIAGNOSIS — R519 Headache, unspecified: Secondary | ICD-10-CM

## 2015-10-11 MED ORDER — ESCITALOPRAM OXALATE 5 MG PO TABS: 5.0000 mg | ORAL_TABLET | Freq: Every day | ORAL | Status: DC

## 2015-10-11 MED ORDER — BECLOMETHASONE DIPROPIONATE 80 MCG/ACT IN AERS: 2.0000 | INHALATION_SPRAY | Freq: Every day | RESPIRATORY_TRACT | Status: DC

## 2015-10-11 NOTE — Progress Notes (Signed)
Subjective:    Patient ID: Regina Ray, female    DOB: January 06, 1946, 69 y.o.   MRN: [id number]  DOS:  10/11/2015 Type of visit - description : Physical exam Interval history: HTN: Compliance of medication, not ambulatory BPs, BP today is very good. Her: On Lipitor, good compliance without apparent side effects. Multiple symptoms, see review of systems   Review of Systems  Constitutional: No fever. No chills. No unexplained wt changes. No unusual sweats  HEENT: No dental problems, no ear discharge, no facial swelling, no voice changes. No eye discharge, no eye  redness , no  intolerance to light   Respiratory: No wheezing , no  difficulty breathing. No cough , no mucus production  Cardiovascular: No CP, no leg swelling . For a while (months?) Has noted dyspnea on exertion when she does heavy housecleaning or takes a long walk. No associated chest pain, wheezing, cough. She does have occasional palpitations with exertion.  GI: no nausea, no vomiting, no diarrhea , no  abdominal pain.  No blood in the stools. No dysphagia, no odynophagia    Endocrine: No polyphagia, no polyuria , no polydipsia  GU: No dysuria, gross hematuria, difficulty urinating. No urinary urgency, no frequency.  Musculoskeletal: No joint swellings or unusual aches or pains  Skin: No change in the color of the skin, palor , no  Rash  Allergic, immunologic: No environmental allergies , no  food allergies  Neurological: No dizziness no  syncope.   No diplopia, no slurred, no slurred speech, no motor deficits, no facial  Numbness. Complaining of global headache for 4 weeks, no associated nausea, no recent head injury, no worse of life but is somewhat unusual. Think is related to allergies  Hematological: No enlarged lymph nodes, no easy bruising , no unusual bleedings  Psychiatry: No suicidal ideas, no hallucinations, no beavior problems, no confusion.  Mild to moderate anxiety, worries about her  children, they have some problems, symptoms are present most days.   Past Medical History  Diagnosis Date  . Anxiety and depression   . Hyperlipidemia   . Allergy   . DJD (degenerative joint disease)   . Fatty liver     per [place] 2008  . Gastroparesis     per EGD 2008  . HTN (hypertension)     Past Surgical History  Procedure Laterality Date  . Appendectomy    . Cardiac surgery      @ age 37 Manufacturing systems engineer communication)  . Abis normal  05-2009    for question of claudication    Social History   Social History  . Marital Status: Married    Spouse Name: N/A  . Number of Children: 4  . Years of Education: N/A   Occupational History  . stay home    Social History Main Topics  . Smoking status: Never Smoker   . Smokeless tobacco: Never Used  . Alcohol Use: No  . Drug Use: No  . Sexual Activity: Not on file   Other Topics Concern  . Not on file   Social History Narrative   Household-- pt, husband, 1 daughter    From [place]   Does not drive     Family History  Problem Relation Age of Onset  . Colon cancer Neg Hx   . Breast cancer Mother     dx age 61  . CAD Other     GM  . Diabetes Neg Hx        Medication  List       This list is accurate as of: 10/11/15 11:59 PM.  Always use your most recent med list.               amLODipine 5 MG tablet  Commonly known as:  NORVASC  Take 5 mg by mouth daily.     atorvastatin 40 MG tablet  Commonly known as:  LIPITOR  Take 40 mg by mouth daily.     beclomethasone 80 MCG/ACT inhaler  Commonly known as:  QVAR  Inhale 2 puffs into the lungs daily.     CALCIUM & VIT D3 BONE HEALTH PO  Take by mouth.     escitalopram 5 MG tablet  Commonly known as:  LEXAPRO  Take 1 tablet (5 mg total) by mouth daily.     fexofenadine 180 MG tablet  Commonly known as:  ALLEGRA  Take 1 tablet (180 mg total) by mouth daily.     fish oil-omega-3 fatty acids 1000 MG capsule  Take 2 g by mouth daily.     losartan 100 MG  tablet  Commonly known as:  COZAAR  Take 100 mg by mouth daily.     MELATONIN PO  Take by mouth as needed.           Objective:   Physical Exam BP 122/60 mmHg  Pulse 71  Temp(Src) 97.6 F (36.4 C) (Oral)  Ht 4\' 10"  (1.473 m)  Wt 176 lb 2 oz (79.89 kg)  BMI 36.82 kg/m2  SpO2 97% General:   Well developed, well nourished . NAD.  Neck:    No  thyromegaly, + JVD at 43?  HEENT:  Normocephalic . Face symmetric, atraumatic Lungs:  CTA B Normal respiratory effort, no intercostal retractions, no accessory muscle use. Heart: RRR,  no murmur.  No pretibial edema bilaterally  Abdomen:  Not distended, soft, non-tender. No rebound or rigidity.   Skin: Exposed areas without rash. Not pale. Not jaundice Neurologic:  alert & oriented X3.  Speech normal, gait appropriate for age and unassisted Strength symmetric and appropriate for age.  Psych: Cognition and judgment appear intact.  Cooperative with normal attention span and concentration.  Behavior appropriate. No anxious or depressed appearing.    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 Intra-auricular communication ) Fatty liver 2008 ? Claudication: Normal ABIs 2010  PLAN HTN: Seems well-controlled. Check a CMP, CBC. Hyperlipidemia: Continue Lipitor, check FLP Anxiety depression: On and off symptoms, we discussed prn vs qd medication, elected daily; previously had palpitation w/ sertraline, will try Lexapro 5 mg, RTC 1 month Headache: Not her CC today but did report headaches lately, will check a sedimentation rate, recommend consistent use of Flonase for allergies. Reassess in one month Dyspnea on exertion: 69 year old lady with history of restrictive lung disease, previous cardiac surgery c/o  DOE and occasional palpitations. EKG  today is at baseline. Symptoms are likely multifactorial however I think they warrant further evaluation. Refer to cardiology, used to see Dr.Harwani?? No records, pt reports no recent ECHO/stress test Re-start Qvar. RTC one month  CPX ans IV

## 2015-10-11 NOTE — Patient Instructions (Signed)
   Please schedule labs to be done within few days (fasting)  Restart Qvar 2 puffs every day  Start Lexapro 5 mg one tablet daily     Next visit  for a routine checkup in 1 month   (30 minutes  ) Please schedule an appointment at the front desk  Please schedule a visit with Casey Burkitt  For a Medicare wellness exam

## 2015-10-11 NOTE — Assessment & Plan Note (Addendum)
Td-2015 pnm 23--2015 Had a prevnar zostavax rx provided before Had a flu shot   Cscope--hyperplastic polyp 01/10/2007, no report, next per GI Had a Pap smear 02/26/2015, Dr. Matthew Saras Schedule a mammogram Diet and exercise discussed

## 2015-10-11 NOTE — Progress Notes (Signed)
Pre visit review using our clinic review tool, if applicable. No additional management support is needed unless otherwise documented below in the visit note. 

## 2015-10-13 DIAGNOSIS — Z09 Encounter for follow-up examination after completed treatment for conditions other than malignant neoplasm: Secondary | ICD-10-CM | POA: Insufficient documentation

## 2015-10-13 NOTE — Assessment & Plan Note (Signed)
HTN: Seems well-controlled. Check a CMP, CBC. Hyperlipidemia: Continue Lipitor, check FLP Anxiety depression: On and off symptoms, we discussed prn vs qd medication, elected daily; previously had palpitation w/ sertraline, will try Lexapro 5 mg, RTC 1 month Headache: Not her CC today but did report headaches lately, will check a sedimentation rate, recommend consistent use of Flonase for allergies. Reassess in one month Dyspnea on exertion: 69 year old lady with history of restrictive lung disease, previous cardiac surgery c/o  DOE and occasional palpitations. EKG today is at baseline. Symptoms are likely multifactorial however I think they warrant further evaluation. Refer to cardiology, used to see Dr.Harwani?? No records, pt reports no recent ECHO/stress test Re-start Qvar. RTC one month

## 2015-10-14 ENCOUNTER — Other Ambulatory Visit: Payer: Medicare HMO

## 2015-10-14 ENCOUNTER — Ambulatory Visit (INDEPENDENT_AMBULATORY_CARE_PROVIDER_SITE_OTHER): Payer: Medicare HMO

## 2015-10-14 VITALS — BP 142/84 | HR 64 | Ht 58.5 in | Wt 177.4 lb

## 2015-10-14 DIAGNOSIS — Z0000 Encounter for general adult medical examination without abnormal findings: Secondary | ICD-10-CM

## 2015-10-14 DIAGNOSIS — Z1159 Encounter for screening for other viral diseases: Secondary | ICD-10-CM | POA: Diagnosis not present

## 2015-10-14 LAB — CBC WITH DIFFERENTIAL/PLATELET
BASOS PCT: 0.4 % (ref 0.0–3.0)
Basophils Absolute: 0 10*3/uL (ref 0.0–0.1)
EOS PCT: 1.9 % (ref 0.0–5.0)
Eosinophils Absolute: 0.1 10*3/uL (ref 0.0–0.7)
HEMATOCRIT: 37.1 % (ref 36.0–46.0)
HEMOGLOBIN: 11.9 g/dL — AB (ref 12.0–15.0)
LYMPHS PCT: 22.7 % (ref 12.0–46.0)
Lymphs Abs: 1.2 10*3/uL (ref 0.7–4.0)
MCHC: 32.1 g/dL (ref 30.0–36.0)
MCV: 89 fl (ref 78.0–100.0)
Monocytes Absolute: 0.3 10*3/uL (ref 0.1–1.0)
Monocytes Relative: 6 % (ref 3.0–12.0)
NEUTROS ABS: 3.8 10*3/uL (ref 1.4–7.7)
Neutrophils Relative %: 69 % (ref 43.0–77.0)
PLATELETS: 275 10*3/uL (ref 150.0–400.0)
RBC: 4.17 Mil/uL (ref 3.87–5.11)
RDW: 15.1 % (ref 11.5–15.5)
WBC: 5.5 10*3/uL (ref 4.0–10.5)

## 2015-10-14 LAB — SEDIMENTATION RATE: Sed Rate: 27 mm/hr — ABNORMAL HIGH (ref 0–22)

## 2015-10-14 LAB — LIPID PANEL
CHOLESTEROL: 135 mg/dL (ref 0–200)
HDL: 44.8 mg/dL (ref >39.00)
LDL CALC: 72 mg/dL (ref 0–99)
NonHDL: 89.96
TRIGLYCERIDES: 88 mg/dL (ref 0.0–149.0)
Total CHOL/HDL Ratio: 3
VLDL: 17.6 mg/dL (ref 0.0–40.0)

## 2015-10-14 LAB — COMPREHENSIVE METABOLIC PANEL
ALBUMIN: 3.8 g/dL (ref 3.5–5.2)
ALT: 15 U/L (ref 0–35)
AST: 19 U/L (ref 0–37)
Alkaline Phosphatase: 72 U/L (ref 39–117)
BUN: 16 mg/dL (ref 6–23)
CALCIUM: 9 mg/dL (ref 8.4–10.5)
CHLORIDE: 104 meq/L (ref 96–112)
CO2: 31 mEq/L (ref 19–32)
Creatinine, Ser: 0.71 mg/dL (ref 0.40–1.20)
GFR: 86.24 mL/min (ref >60.00)
Glucose, Bld: 100 mg/dL — ABNORMAL HIGH (ref 70–99)
POTASSIUM: 4.4 meq/L (ref 3.5–5.1)
SODIUM: 140 meq/L (ref 135–145)
Total Bilirubin: 0.5 mg/dL (ref 0.2–1.2)
Total Protein: 6.5 g/dL (ref 6.0–8.3)

## 2015-10-14 NOTE — Progress Notes (Addendum)
Subjective:   Regina Ray is a 69 y.o. female who presents for Medicare Annual (Subsequent) preventive examination.  Regina Ray is spanish speaking.  Her daughter is here to interpret per her preference.    Review of Systems: No ROS Cardiac Risk Factors include: advanced age (>80men, >87 women);obesity (BMI >30kg/m2);hypertension  Sleep patterns:   Sleeps approximately 7 hours per night/takes naps in the afternoon/does not wake up during the night to void.   Home Safety/Smoke Alarms:  Feels safe at home.  Lives with husband and daughter.  Smoke alarms present.   Firearm Safety: No firearms.   Seat Belt Safety/Bike Helmet:  Always wears seat belt.    Counseling:   Eye Exam- Years ago; wears [facility]- it's been a while; plans to make an appointment Female:  Pap- 02/26/15- negative-Dr. Matthew Saras   Mammo- Encouraged to schedule     Dexa scan-Encouraged to schedule   CCS-hyperplastic polyp 01/10/2007, no report, next per GI      Objective:     Vitals: BP 142/84 mmHg  Pulse 64  Ht 4' 10.5" (1.486 m)  Wt 177 lb 6.4 oz (80.468 kg)  BMI 36.44 kg/m2  SpO2 98%  Tobacco History  Smoking status  . Never Smoker   Smokeless tobacco  . Never Used     Counseling given: No   Past Medical History  Diagnosis Date  . Anxiety and depression   . Hyperlipidemia   . Allergy   . DJD (degenerative joint disease)   . Fatty liver     per [place] 2008  . Gastroparesis     per EGD 2008  . HTN (hypertension)    Past Surgical History  Procedure Laterality Date  . Appendectomy    . Cardiac surgery      @ age 28 Manufacturing systems engineer communication)  . Abis normal  05-2009    for question of claudication   Family History  Problem Relation Age of Onset  . Colon cancer Neg Hx   . Breast cancer Mother     dx age 32  . CAD Other     GM  . Diabetes Neg Hx    History  Sexual Activity  . Sexual Activity: Not on file    Outpatient Encounter Prescriptions as of 10/14/2015  Medication  Sig  . amLODipine (NORVASC) 5 MG tablet Take 5 mg by mouth daily.  Marland Kitchen atorvastatin (LIPITOR) 40 MG tablet Take 40 mg by mouth daily.  . fexofenadine (ALLEGRA) 180 MG tablet Take 1 tablet (180 mg total) by mouth daily.  . fish oil-omega-3 fatty acids 1000 MG capsule Take 2 g by mouth daily.  Marland Kitchen losartan (COZAAR) 100 MG tablet Take 100 mg by mouth daily.  . Multiple Minerals-Vitamins (CALCIUM & VIT D3 BONE HEALTH PO) Take by mouth.  . naproxen (NAPROSYN) 250 MG tablet Take 500 mg by mouth daily as needed.  . ranitidine (ZANTAC) 300 MG tablet Take 300 mg by mouth daily.  . beclomethasone (QVAR) 80 MCG/ACT inhaler Inhale 2 puffs into the lungs daily. (Patient not taking: Reported on 10/14/2015)  . escitalopram (LEXAPRO) 5 MG tablet Take 1 tablet (5 mg total) by mouth daily. (Patient not taking: Reported on 10/14/2015)  . [DISCONTINUED] MELATONIN PO Take by mouth as needed.   No facility-administered encounter medications on file as of 10/14/2015.    Activities of Daily Living In your present state of health, do you have any difficulty performing the following activities: 10/14/2015 10/11/2015  Hearing? N N  Vision? N N  Difficulty concentrating or making decisions? N N  Walking or climbing stairs? N N  Dressing or bathing? N N  Doing errands, shopping? N N  Preparing Food and eating ? N -  Using the Toilet? N -  In the past six months, have you accidently leaked urine? N -  Do you have problems with loss of bowel control? N -  Managing your Medications? N -  Managing your Finances? N -  Housekeeping or managing your Housekeeping? N -    Patient Care Team: Colon Branch, MD as PCP - General Charlies Silvers, MD as Consulting Physician (Allergy) Molli Posey, MD as Consulting Physician (Obstetrics and Gynecology)    Assessment:  Assessment completed by provider 10/11/15.  Exercise Activities and Dietary recommendations Current Exercise Habits:: The patient does not participate in  regular exercise at present [facility] the dog every day for 20 mins and clean home.)   Diet:  Eats 3 times per day with snacks.  Sometimes she eats healthy, sometimes she doesn't.  Drinks 3-4 bottles per day.    Goals    . Lose 12 lbs by next year.        Fall Risk Fall Risk  10/14/2015 10/11/2015 08/31/2014  Falls in the past year? No No No   Depression Screen PHQ 2/9 Scores 10/14/2015 10/11/2015 08/31/2014  PHQ - 2 Score 0 0 0     Cognitive Testing AD8 screening completed:  0/8.    Immunization History  Administered Date(s) Administered  . Influenza Split 08/31/2014  . Influenza Whole 08/02/2009, 08/08/2010, 06/23/2013  . Influenza,inj,Quad PF,36+ Mos 07/17/2015  . Pneumococcal Conjugate-13 06/23/2013  . Pneumococcal Polysaccharide-23 08/31/2014  . Td 08/31/2014   Screening Tests Health Maintenance  Topic Date Due  . Hepatitis C Screening  1946/03/05  . COLONOSCOPY  09/21/1994  . DEXA SCAN  09/21/2009  . MAMMOGRAM  10/28/2012  . ZOSTAVAX  10/10/2016 (Originally 09/21/2004)  . PAP SMEAR  02/26/2016  . INFLUENZA VACCINE  06/02/2016  . TETANUS/TDAP  08/31/2024  . PNA vac Low Risk Adult  Completed      Plan:  Will make Dr. Larose Kells aware that the two new medications prescribe (Lexapro and Qvar) were too expensive.  Please call insurance company for alternatives listed on formulary.    Eat heart healthy diet (full of fruits, vegetables, whole grains, lean protein, water--limit salt, fat, and sugar intake) and increase physical activity as tolerated.  Continue doing brain stimulating activities (puzzles, reading, adult coloring books, staying active) to keep memory sharp.   Schedule Mammogram and bone density.    Go to lab for lab work.  You should hear back from [place] regarding lab results in 7 days.  Follow up with Dr. Larose Kells as scheduled.    During the course of the visit the patient was educated and counseled about the following appropriate screening and preventive services:    Vaccines to include Pneumoccal, Influenza, Hepatitis B, Td, Zostavax, HCV  Electrocardiogram  Cardiovascular Disease  Colorectal cancer screening  Bone density screening  Diabetes screening  Glaucoma screening  Mammography/PAP  Nutrition counseling   Patient Instructions (the written plan) was given to the patient.   Rudene Anda, RN  10/14/2015   Agree  J PAZ M.D.

## 2015-10-14 NOTE — Progress Notes (Signed)
Pre visit review using our clinic review tool, if applicable. No additional management support is needed unless otherwise documented below in the visit note. 

## 2015-10-14 NOTE — Addendum Note (Signed)
Addended by: Peggyann Shoals on: 10/14/2015 09:43 AM   Modules accepted: Orders

## 2015-10-14 NOTE — Patient Instructions (Addendum)
Will make Dr. Larose Kells aware that the two new medications prescribe (Lexapro and Qvar) were too expensive.  Please call insurance company for alternatives listed on formulary.    Eat heart healthy diet (full of fruits, vegetables, whole grains, lean protein, water--limit salt, fat, and sugar intake) and increase physical activity as tolerated.  Continue doing brain stimulating activities (puzzles, reading, adult coloring books, staying active) to keep memory sharp.   Schedule Mammogram and bone density.    Go to lab for lab work.  You should hear back from [place] regarding lab results in 7 days.  Follow up with Dr. Larose Kells as scheduled.

## 2015-10-15 ENCOUNTER — Telehealth: Payer: Self-pay | Admitting: Internal Medicine

## 2015-10-15 LAB — HEPATITIS C ANTIBODY: HCV Ab: NEGATIVE

## 2015-10-15 NOTE — Telephone Encounter (Signed)
I was advise by the scheduler at the [facility] to do the  Mammogram  at [facility] as she needs a diagnostic mammogram. They can also do her bone density test. Please arrange.

## 2015-11-15 ENCOUNTER — Encounter: Payer: Self-pay | Admitting: Cardiology

## 2015-11-15 ENCOUNTER — Ambulatory Visit (INDEPENDENT_AMBULATORY_CARE_PROVIDER_SITE_OTHER): Payer: Medicare HMO | Admitting: Cardiology

## 2015-11-15 VITALS — BP 152/80 | HR 78 | Ht 59.0 in | Wt 173.0 lb

## 2015-11-15 DIAGNOSIS — I10 Essential (primary) hypertension: Secondary | ICD-10-CM

## 2015-11-15 DIAGNOSIS — R0600 Dyspnea, unspecified: Secondary | ICD-10-CM

## 2015-11-15 DIAGNOSIS — R079 Chest pain, unspecified: Secondary | ICD-10-CM | POA: Diagnosis not present

## 2015-11-15 DIAGNOSIS — Z803 Family history of malignant neoplasm of breast: Secondary | ICD-10-CM | POA: Diagnosis not present

## 2015-11-15 DIAGNOSIS — E785 Hyperlipidemia, unspecified: Secondary | ICD-10-CM | POA: Diagnosis not present

## 2015-11-15 DIAGNOSIS — Z1231 Encounter for screening mammogram for malignant neoplasm of breast: Secondary | ICD-10-CM | POA: Diagnosis not present

## 2015-11-15 NOTE — Patient Instructions (Signed)
Medication Instructions:  The current medical regimen is effective;  continue present plan and medications.  Testing/Procedures: Your physician has requested that you have an echocardiogram. Echocardiography is a painless test that uses sound waves to create images of your heart. It provides your doctor with information about the size and shape of your heart and how well your heart's chambers and valves are working. This procedure takes approximately one hour. There are no restrictions for this procedure.  Your physician has requested that you have a lexiscan myoview. For further information please visit [email or web address]. Please follow instruction sheet, as given.  A chest x-ray takes a picture of the organs and structures inside the chest, including the heart, lungs, and blood vessels.  This will be completed at [place].  This test can show several things, including, whether the heart is enlarges; whether fluid is building up in the lungs; and whether pacemaker / defibrillator leads are still in place.  Follow-Up: Follow up as needed after testing.  If you need a refill on your cardiac medications before your next appointment, please call your pharmacy.  Thank you for choosing [facility]!!

## 2015-11-15 NOTE — Progress Notes (Signed)
Cardiology Office Note   Date:  11/15/2015   ID:  Regina Ray, DOB October 14, 1946, MRN [id number]  PCP:  Kathlene November, MD  Cardiologist:   Candee Furbish, MD     History of Present Illness: Regina Ray is a 70 y.o. female here for evaluation of dyspnea on exertion which is been present over the past year especially during heavy housecleaning or long walks or when carrying a heavy load upstairs. 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 Has had bouts of bronchitis in the past. Last chest x-ray was in 2013 and showed some bronchitic changes.   At times she may feel palpitations with exertion. She has been on Lipitor with good compliance.  No recent stress test. No recent echocardiogram  Hemoglobin 11.9, LDL 72, other labs unremarkable    Past Medical History  Diagnosis Date  . Anxiety and depression   . Hyperlipidemia   . Allergy   . DJD (degenerative joint disease)   . Fatty liver     per [place] 2008  . Gastroparesis     per EGD 2008  . HTN (hypertension)   . Bronchitis     Past Surgical History  Procedure Laterality Date  . Appendectomy    . 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 Abis normal  05-2009    for question of claudication     Current Outpatient Prescriptions  Medication Sig Dispense Refill  . amLODipine (NORVASC) 5 MG tablet Take 5 mg by mouth daily.    Marland Kitchen atorvastatin (LIPITOR) 40 MG tablet Take 40 mg by mouth daily.    . beclomethasone (QVAR) 80 MCG/ACT inhaler Inhale 2 puffs into the lungs daily. 1 Inhaler 12  . escitalopram (LEXAPRO) 5 MG tablet Take 1 tablet (5 mg total) by mouth daily. 30 tablet 1  . fish oil-omega-3 fatty acids 1000 MG capsule Take 2 g by mouth daily.    Marland Kitchen losartan (COZAAR) 100 MG tablet Take 100 mg by  mouth daily.    . Multiple Minerals-Vitamins (CALCIUM & VIT D3 BONE HEALTH PO) Take by mouth.    . naproxen (NAPROSYN) 250 MG tablet Take 500 mg by mouth daily as needed.    . ranitidine (ZANTAC) 300 MG tablet Take 300 mg by mouth daily.     No current facility-administered medications for this visit.    Allergies:   Penicillins and Sertraline hcl    Social History:  The patient  reports that she has never smoked. She has never used smokeless tobacco. She reports that she does not drink alcohol or use illicit drugs.   Family History:  The patient's family history includes Breast cancer in her mother; CAD in her other. There is no history of Colon cancer or Diabetes.    ROS:  Please see the history of present illness.   Otherwise, review of systems are positive for none.   All other systems are reviewed and negative.    PHYSICAL EXAM: VS:  BP 152/80 mmHg  Pulse 78  Ht 4\' 11"  (1.499 m)  Wt 173 lb (78.472 kg)  BMI 34.92 kg/m2  SpO2 96% , BMI Body mass index is 34.92 kg/(m^2). GEN: Well nourished, well developed, in no acute distress HEENT: normal Neck: no JVD, carotid bruits, or masses  Cardiac: RRR; no murmurs, rubs, or gallops,no edema prior Chest scar noted Respiratory:  clear to auscultation bilaterally, normal work of breathing GI: soft, nontender, nondistended, + BS MS: no deformity or atrophy Skin: warm and dry, no rash, lower extremity with chronic edema changes, varicose Neuro:  Strength and sensation are intact Psych: euthymic mood, full affect   EKG:  10/11/15-personally viewed Sinus rhythm 72 without any other significant abnormalities  Recent Labs: 10/14/2015: ALT 15; BUN 16; Creatinine, Ser 0.71; Hemoglobin 11.9*; Platelets 275.0; Potassium 4.4; Sodium 140    Lipid Panel    Component Value Date/Time   CHOL 135 10/14/2015 0943   TRIG 88.0 10/14/2015 0943   HDL 44.80 10/14/2015 0943   CHOLHDL 3 10/14/2015 0943   VLDL 17.6 10/14/2015 0943   LDLCALC 72  10/14/2015 0943   LDLDIRECT 145.3 05/20/2012 1243      Wt Readings from Last 3 Encounters:  11/15/15 173 lb (78.472 kg)  10/14/15 177 lb 6.4 oz (80.468 kg)  10/11/15 176 lb 2 oz (79.89 kg)      Other studies Reviewed: Additional studies/ records that were reviewed today include: Lab work, office notes, chest x-ray, EKG. 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 I wonder if there any manifestations of pulmonary hypertension.  - I will check a chest x-ray  - I will also check a nuclear stress test, pharmacologic to ensure that her dyspnea on exertion and occasional atypical chest discomfort is not ischemic  - Other factors may be deconditioning, increased weight/obesity/essential hypertension. No smoking history. Prior bronchitis.   Obesity  - Encourage weight loss  Essential hypertension  - Education reviewed as above.   - Mildly elevated today.  - Dr. Larose Kells watching.  Hyperlipidemia  - Excellent atorvastatin 40  - Lab work reviewed    Current medicines are reviewed at length with the patient today.  The patient does not have concerns regarding medicines.  The following changes have been made:  no change  Labs/ tests ordered today include:   Orders Placed This Encounter  Procedures  . DG Chest 2 View  . Myocardial Perfusion Imaging  . Echocardiogram     Disposition:   I will follow-up with results of testing and as needed after that.  Bobby Rumpf, MD  11/15/2015 2:51 PM    [facility] [address], [place], [place]  [address] Phone: [phone]; Fax: [phone]

## 2015-11-22 ENCOUNTER — Encounter: Payer: Self-pay | Admitting: Internal Medicine

## 2015-11-22 ENCOUNTER — Ambulatory Visit (INDEPENDENT_AMBULATORY_CARE_PROVIDER_SITE_OTHER): Payer: Medicare HMO | Admitting: Internal Medicine

## 2015-11-22 ENCOUNTER — Ambulatory Visit (HOSPITAL_BASED_OUTPATIENT_CLINIC_OR_DEPARTMENT_OTHER)
Admission: RE | Admit: 2015-11-22 | Discharge: 2015-11-22 | Disposition: A | Discharge status: Home / Self Care | Payer: Medicare HMO | Source: Ambulatory Visit | Attending: Internal Medicine | Admitting: Internal Medicine

## 2015-11-22 VITALS — BP 122/68 | HR 67 | Temp 97.8°F | Ht 59.0 in | Wt 175.4 lb

## 2015-11-22 DIAGNOSIS — F329 Major depressive disorder, single episode, unspecified: Secondary | ICD-10-CM

## 2015-11-22 DIAGNOSIS — R938 Abnormal findings on diagnostic imaging of other specified body structures: Secondary | ICD-10-CM | POA: Insufficient documentation

## 2015-11-22 DIAGNOSIS — G8929 Other chronic pain: Secondary | ICD-10-CM

## 2015-11-22 DIAGNOSIS — R519 Headache, unspecified: Secondary | ICD-10-CM

## 2015-11-22 DIAGNOSIS — R51 Headache: Secondary | ICD-10-CM | POA: Diagnosis not present

## 2015-11-22 DIAGNOSIS — R0600 Dyspnea, unspecified: Secondary | ICD-10-CM

## 2015-11-22 DIAGNOSIS — R0602 Shortness of breath: Secondary | ICD-10-CM | POA: Diagnosis not present

## 2015-11-22 DIAGNOSIS — F418 Other specified anxiety disorders: Secondary | ICD-10-CM | POA: Diagnosis not present

## 2015-11-22 DIAGNOSIS — R0609 Other forms of dyspnea: Secondary | ICD-10-CM

## 2015-11-22 DIAGNOSIS — F32A Depression, unspecified: Secondary | ICD-10-CM

## 2015-11-22 DIAGNOSIS — F419 Anxiety disorder, unspecified: Principal | ICD-10-CM

## 2015-11-22 MED ORDER — FLUOXETINE HCL 20 MG PO TABS: 20.0000 mg | ORAL_TABLET | Freq: Every day | ORAL | Status: DC

## 2015-11-22 NOTE — Progress Notes (Signed)
Subjective:    Patient ID: Regina Ray, female    DOB: November 27, 1945, 70 y.o.   MRN: [id number]  DOS:  11/22/2015 Type of visit - description : Follow-up from previous visit, here w/ her daughter Interval history: Anxiety depression: Ongoing symptoms, was unable to afford Lexapro, would like to try something different Headache: Was recommended Flonase, not using it consistently, still has headaches, on and off, global, sometimes intense, no associated with nausea. DOE: On Qvar, slightly better. Saw cardiology, note reviewed. Labs done since the last office visit reviewed. All normal  Review of Systems Denies neck pain No dizziness, double vision, diplopia or slurred speech  Past Medical History  Diagnosis Date  . Anxiety and depression   . Hyperlipidemia   . Allergy   . DJD (degenerative joint disease)   . Fatty liver     per [place] 2008  . Gastroparesis     per EGD 2008  . HTN (hypertension)   . Bronchitis     Past Surgical History  Procedure Laterality Date  . Appendectomy    . 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 Abis normal  05-2009    for question of claudication    Social History   Social History  . Marital Status: Married    Spouse Name: N/A  . Number of Children: 4  . Years of Education: N/A   Occupational History  . stay home    Social History Main Topics  . Smoking status: Never Smoker   . Smokeless tobacco: Never Used  . Alcohol Use: No  . Drug Use: No  . Sexual Activity: Not on file   Other Topics Concern  . Not on file   Social History Narrative   Household-- pt, husband, 1 daughter    From [place]   Does not drive        Medication List       This list is accurate as of: 11/22/15 11:59 PM.  Always use your most recent med list.               amLODipine 5 MG tablet  Commonly known as:  NORVASC  Take 5 mg by mouth daily.     atorvastatin 40 MG tablet  Commonly known as:  LIPITOR  Take 40 mg by  mouth daily.     beclomethasone 80 MCG/ACT inhaler  Commonly known as:  QVAR  Inhale 2 puffs into the lungs daily.     CALCIUM & VIT D3 BONE HEALTH PO  Take by mouth.     fish oil-omega-3 fatty acids 1000 MG capsule  Take 2 g by mouth daily.     FLUoxetine 20 MG tablet  Commonly known as:  PROZAC  Take 1 tablet (20 mg total) by mouth daily.     losartan 100 MG tablet  Commonly known as:  COZAAR  Take 100 mg by mouth daily.     naproxen 250 MG tablet  Commonly known as:  NAPROSYN  Take 500 mg by mouth daily as needed.     ranitidine 300 MG tablet  Commonly known as:  ZANTAC  Take 300 mg by mouth daily.           Objective:   Physical Exam BP 122/68 mmHg  Pulse 67  Temp(Src) 97.8 F (36.6 C) (Oral)  Ht 4\' 11"  (1.499 m)  Wt 175 lb 6 oz (79.55 kg)  BMI 35.40 kg/m2  SpO2 95% General:  Well developed, well nourished . NAD.  HEENT:  Normocephalic . Face symmetric, atraumatic Lungs:  CTA B Normal respiratory effort, no intercostal retractions, no accessory muscle use. Heart: RRR,  no murmur.  No pretibial edema bilaterally  Skin: Not pale. Not jaundice Neurologic:  alert & oriented X3.  Speech normal, gait appropriate for age and unassisted. EOMI, DTRs symmetric. Psych--  Cognition and judgment appear intact.  Cooperative with normal attention span and concentration.  Behavior appropriate. No anxious or depressed appearing.      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 Intra-auricular communication ) Fatty liver 2008 ? Claudication: Normal ABIs 2010  PLAN See previous OV notes  Anxiety depression: Unable to afford Lexapro, trial with Prozac. See instructions Headache: Ongoing problem, denies neck pain, neuro  exam negative, recent sedimentation rate normal. Check MRI, reassess in 6 weeks. May improve with SSRIs DOE: on Qvar, slightly better?.  saw cardiology, echocardiogram and stress test pending. Needs a chest x-ray to be done today. RTC 6 weeks.

## 2015-11-22 NOTE — Progress Notes (Signed)
Pre visit review using our clinic review tool, if applicable. No additional management support is needed unless otherwise documented below in the visit note. 

## 2015-11-22 NOTE — Patient Instructions (Addendum)
BEFORE YOU LEAVE THE OFFICE: GO TO THE FRONT DESK  Schedule a routine office visit or check up to be done in  6 weeks , no   fasting    AFTER YOU LEAVE THE OFFICE:  Stop by the first floor and get the XR    empieze fluoxetina de 20 mg : 1/2 tableta cada dia por 10 dias, luego una tableta  Use el flonase para la [employer]

## 2015-11-26 ENCOUNTER — Telehealth: Payer: Self-pay | Admitting: Internal Medicine

## 2015-11-26 NOTE — Telephone Encounter (Signed)
Can be reached: [phone] Opt 4 then opt 2   Reason for call: [employer] need to discuss the MRI this patient had order. States that it is being denied until detailed explanation of why it was needed.

## 2015-11-27 ENCOUNTER — Telehealth (HOSPITAL_COMMUNITY): Payer: Self-pay | Admitting: *Deleted

## 2015-11-27 NOTE — Telephone Encounter (Signed)
Attempted to call patient regarding upcoming appointment- no answer. Neithan Day J Ketura Sirek, RN 

## 2015-12-02 ENCOUNTER — Ambulatory Visit (HOSPITAL_COMMUNITY): Payer: Medicare HMO | Attending: Internal Medicine

## 2015-12-02 ENCOUNTER — Other Ambulatory Visit: Payer: Self-pay

## 2015-12-02 ENCOUNTER — Ambulatory Visit (HOSPITAL_BASED_OUTPATIENT_CLINIC_OR_DEPARTMENT_OTHER): Payer: Medicare HMO

## 2015-12-02 DIAGNOSIS — I10 Essential (primary) hypertension: Secondary | ICD-10-CM | POA: Insufficient documentation

## 2015-12-02 DIAGNOSIS — R0600 Dyspnea, unspecified: Secondary | ICD-10-CM | POA: Diagnosis not present

## 2015-12-02 DIAGNOSIS — R002 Palpitations: Secondary | ICD-10-CM | POA: Insufficient documentation

## 2015-12-02 DIAGNOSIS — R0609 Other forms of dyspnea: Secondary | ICD-10-CM | POA: Diagnosis not present

## 2015-12-02 DIAGNOSIS — I340 Nonrheumatic mitral (valve) insufficiency: Secondary | ICD-10-CM | POA: Insufficient documentation

## 2015-12-02 DIAGNOSIS — I358 Other nonrheumatic aortic valve disorders: Secondary | ICD-10-CM | POA: Diagnosis not present

## 2015-12-02 DIAGNOSIS — I5189 Other ill-defined heart diseases: Secondary | ICD-10-CM | POA: Insufficient documentation

## 2015-12-02 DIAGNOSIS — R079 Chest pain, unspecified: Secondary | ICD-10-CM

## 2015-12-02 DIAGNOSIS — I517 Cardiomegaly: Secondary | ICD-10-CM | POA: Insufficient documentation

## 2015-12-02 DIAGNOSIS — Z8249 Family history of ischemic heart disease and other diseases of the circulatory system: Secondary | ICD-10-CM | POA: Diagnosis not present

## 2015-12-02 DIAGNOSIS — I351 Nonrheumatic aortic (valve) insufficiency: Secondary | ICD-10-CM | POA: Diagnosis not present

## 2015-12-02 DIAGNOSIS — I071 Rheumatic tricuspid insufficiency: Secondary | ICD-10-CM | POA: Insufficient documentation

## 2015-12-02 LAB — MYOCARDIAL PERFUSION IMAGING
CHL CUP NUCLEAR SRS: 11
CHL CUP RESTING HR STRESS: 50 {beats}/min
LHR: 0.31
LV sys vol: 20 mL
LVDIAVOL: 66 mL
NUC STRESS TID: 1.05
Peak HR: 81 {beats}/min
SDS: 5
SSS: 16

## 2015-12-02 MED ORDER — TECHNETIUM TC 99M SESTAMIBI GENERIC - CARDIOLITE
32.8000 | Freq: Once | INTRAVENOUS | Status: AC | PRN
  Administered 2015-12-02: 32.8 via INTRAVENOUS

## 2015-12-02 MED ORDER — TECHNETIUM TC 99M SESTAMIBI GENERIC - CARDIOLITE
10.8000 | Freq: Once | INTRAVENOUS | Status: AC | PRN
  Administered 2015-12-02: 11 via INTRAVENOUS

## 2015-12-02 MED ORDER — REGADENOSON 0.4 MG/5ML IV SOLN
0.4000 mg | Freq: Once | INTRAVENOUS | Status: AC
  Administered 2015-12-02: 0.4 mg via INTRAVENOUS

## 2015-12-03 ENCOUNTER — Encounter: Payer: Self-pay | Admitting: *Deleted

## 2015-12-09 ENCOUNTER — Encounter: Payer: Self-pay | Admitting: Internal Medicine

## 2016-01-03 ENCOUNTER — Ambulatory Visit (INDEPENDENT_AMBULATORY_CARE_PROVIDER_SITE_OTHER): Payer: Medicare HMO | Admitting: Internal Medicine

## 2016-01-03 ENCOUNTER — Encounter: Payer: Self-pay | Admitting: Internal Medicine

## 2016-01-03 VITALS — BP 126/60 | HR 60 | Temp 98.1°F | Ht 59.0 in | Wt 177.0 lb

## 2016-01-03 DIAGNOSIS — F32A Depression, unspecified: Secondary | ICD-10-CM

## 2016-01-03 DIAGNOSIS — Z09 Encounter for follow-up examination after completed treatment for conditions other than malignant neoplasm: Secondary | ICD-10-CM

## 2016-01-03 DIAGNOSIS — F418 Other specified anxiety disorders: Secondary | ICD-10-CM

## 2016-01-03 DIAGNOSIS — J984 Other disorders of lung: Secondary | ICD-10-CM

## 2016-01-03 DIAGNOSIS — F419 Anxiety disorder, unspecified: Principal | ICD-10-CM

## 2016-01-03 DIAGNOSIS — F329 Major depressive disorder, single episode, unspecified: Secondary | ICD-10-CM

## 2016-01-03 MED ORDER — FLUOXETINE HCL 20 MG PO TABS: 20.0000 mg | ORAL_TABLET | Freq: Every day | ORAL | Status: DC

## 2016-01-03 MED ORDER — BECLOMETHASONE DIPROPIONATE 80 MCG/ACT IN AERS: 2.0000 | INHALATION_SPRAY | Freq: Every day | RESPIRATORY_TRACT | Status: DC

## 2016-01-03 NOTE — Progress Notes (Signed)
Subjective:    Patient ID: Regina Ray, female    DOB: [birth year], 70 y.o.   MRN: [id number]  DOS:  01/03/2016 Type of visit - description : Previous visit, here with her daughter Interval history: Anxiety depression: On fluoxetine, feeling better, not taking her medication every day, afraid is too much medicine Also complaining of dry cough for the last few days, occasional white clear sputum, taking Benadryl, out of Qvar.   Review of Systems  denies fever chills Occasionally itchy eyes, nose and sneezing Mild sinus congestion with watery discharge.  Past Medical History  Diagnosis Date  . Anxiety and depression   . Hyperlipidemia   . Allergy   . DJD (degenerative joint disease)   . Fatty liver     per [place] 2008  . Gastroparesis     per EGD 2008  . HTN (hypertension)   . Bronchitis     Past Surgical History  Procedure Laterality Date  . Appendectomy    . 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 Abis normal  05-2009    for question of claudication    Social History   Social History  . Marital Status: Married    Spouse Name: N/A  . Number of Children: 4  . Years of Education: N/A   Occupational History  . stay home    Social History Main Topics  . Smoking status: Never Smoker   . Smokeless tobacco: Never Used  . Alcohol Use: No  . Drug Use: No  . Sexual Activity: Not on file   Other Topics Concern  . Not on file   Social History Narrative   Household-- pt, husband, 1 daughter    From [place]   Does not drive        Medication List       This list is accurate as of: 01/03/16 11:59 PM.  Always use your most recent med list.               amLODipine 5 MG tablet  Commonly known as:  NORVASC  Take 5 mg by mouth daily.     atorvastatin 40 MG tablet  Commonly known as:  LIPITOR  Take 40 mg by mouth daily.     beclomethasone 80 MCG/ACT inhaler  Commonly known as:  QVAR  Inhale 2 puffs into the lungs daily.     CALCIUM & VIT D3 BONE HEALTH PO  Take by mouth.     fish oil-omega-3 fatty acids 1000 MG capsule  Take 2 g by mouth daily.     FLUoxetine 20 MG tablet  Commonly known as:  PROZAC  Take 1 tablet (20 mg total) by mouth daily.     losartan 100 MG tablet  Commonly known as:  COZAAR  Take 100 mg by mouth daily.     naproxen 250 MG tablet  Commonly known as:  NAPROSYN  Take 500 mg by mouth daily as needed.     ranitidine 300 MG tablet  Commonly known as:  ZANTAC  Take 300 mg by mouth daily.           Objective:   Physical Exam BP 126/60 mmHg  Pulse 60  Temp(Src) 98.1 F (36.7 C) (Oral)  Ht 4\' 11"  (1.499 m)  Wt 177 lb (80.287 kg)  BMI 35.73 kg/m2  SpO2 97% General:   Well developed, well nourished . NAD.  HEENT:  Normocephalic . Face symmetric, atraumatic. Nose is  slightly congested Lungs:  CTA B Normal respiratory effort, no intercostal retractions, no accessory muscle use. Heart: RRR,  no murmur.  No pretibial edema bilaterally  Skin: Not pale. Not jaundice Neurologic:  alert & oriented X3.  Speech normal, gait appropriate for age and unassisted Psych--  Cognition and judgment appear intact.  Cooperative with normal attention span and concentration.  Behavior appropriate. 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 Intra-auricular communication ) Fatty liver 2008 ? Claudication: Normal ABIs 2010 DOE: saw cards >> 12/02/2015: Stress test low risk, echocardiogram reassuring (see report)  PLAN Anxiety-depression--  On fluoxetine, better, taking SSRIs inconsistently, recommend to take 1 tablet daily. Restrictive lung disease: Complaining of cough and some nasal  congestion, recommend to go back on Qvar, start Flonase, call if not better DOE: Recent echocardiogram and stress test low risk.  RTC 5-6 months

## 2016-01-03 NOTE — Progress Notes (Signed)
Pre visit review using our clinic review tool, if applicable. No additional management support is needed unless otherwise documented below in the visit note. 

## 2016-01-03 NOTE — Patient Instructions (Addendum)
Use flonase-- 2 sprays a cada lado de la [employer]  Use Qvar: 2 sprays todos los dias  Llame si no se mejora en una semana   Tome fluoxetina todos los dias         GO TO THE FRONT DESK  Schedule a routine office visit or check up to be done in  5-6 months  No  fasting

## 2016-01-05 DIAGNOSIS — J984 Other disorders of lung: Secondary | ICD-10-CM | POA: Insufficient documentation

## 2016-01-05 NOTE — Assessment & Plan Note (Signed)
Anxiety-depression--  On fluoxetine, better, taking SSRIs inconsistently, recommend to take 1 tablet daily. Restrictive lung disease: Complaining of cough and some nasal congestion, recommend to go back on Qvar, start Flonase, call if not better DOE: Recent echocardiogram and stress test low risk.  RTC 5-6 months

## 2016-01-06 ENCOUNTER — Other Ambulatory Visit: Payer: Self-pay

## 2016-06-05 ENCOUNTER — Ambulatory Visit: Payer: Medicare HMO | Admitting: Internal Medicine

## 2016-06-05 DIAGNOSIS — Z0289 Encounter for other administrative examinations: Secondary | ICD-10-CM

## 2016-06-11 ENCOUNTER — Encounter: Payer: Self-pay | Admitting: Internal Medicine

## 2016-06-12 DIAGNOSIS — Z23 Encounter for immunization: Secondary | ICD-10-CM | POA: Diagnosis not present

## 2016-09-11 ENCOUNTER — Ambulatory Visit (INDEPENDENT_AMBULATORY_CARE_PROVIDER_SITE_OTHER): Payer: Medicare HMO | Admitting: Internal Medicine

## 2016-09-11 ENCOUNTER — Ambulatory Visit (HOSPITAL_BASED_OUTPATIENT_CLINIC_OR_DEPARTMENT_OTHER)
Admission: RE | Admit: 2016-09-11 | Discharge: 2016-09-11 | Disposition: A | Discharge status: Home / Self Care | Payer: Medicare HMO | Source: Ambulatory Visit | Attending: Internal Medicine | Admitting: Internal Medicine

## 2016-09-11 ENCOUNTER — Encounter: Payer: Self-pay | Admitting: Internal Medicine

## 2016-09-11 VITALS — BP 124/72 | HR 71 | Temp 97.7°F | Resp 14 | Ht 59.0 in | Wt 181.0 lb

## 2016-09-11 DIAGNOSIS — M159 Polyosteoarthritis, unspecified: Secondary | ICD-10-CM

## 2016-09-11 DIAGNOSIS — M150 Primary generalized (osteo)arthritis: Secondary | ICD-10-CM | POA: Diagnosis not present

## 2016-09-11 DIAGNOSIS — M549 Dorsalgia, unspecified: Secondary | ICD-10-CM | POA: Diagnosis not present

## 2016-09-11 DIAGNOSIS — I700 Atherosclerosis of aorta: Secondary | ICD-10-CM | POA: Insufficient documentation

## 2016-09-11 DIAGNOSIS — I10 Essential (primary) hypertension: Secondary | ICD-10-CM

## 2016-09-11 DIAGNOSIS — J984 Other disorders of lung: Secondary | ICD-10-CM | POA: Diagnosis not present

## 2016-09-11 DIAGNOSIS — M5136 Other intervertebral disc degeneration, lumbar region: Secondary | ICD-10-CM | POA: Insufficient documentation

## 2016-09-11 DIAGNOSIS — M5134 Other intervertebral disc degeneration, thoracic region: Secondary | ICD-10-CM | POA: Insufficient documentation

## 2016-09-11 DIAGNOSIS — Z8781 Personal history of (healed) traumatic fracture: Secondary | ICD-10-CM

## 2016-09-11 DIAGNOSIS — M2550 Pain in unspecified joint: Secondary | ICD-10-CM | POA: Diagnosis not present

## 2016-09-11 LAB — CBC WITH DIFFERENTIAL/PLATELET
BASOS ABS: 0 {cells}/uL (ref 0–200)
BASOS PCT: 0 %
EOS ABS: 70 {cells}/uL (ref 15–500)
Eosinophils Relative: 1 %
HEMATOCRIT: 37.2 % (ref 35.0–45.0)
Hemoglobin: 11.9 g/dL (ref 11.7–15.5)
LYMPHS PCT: 28 %
Lymphs Abs: 1960 cells/uL (ref 850–3900)
MCH: 28.5 pg (ref 27.0–33.0)
MCHC: 32 g/dL (ref 32.0–36.0)
MCV: 89.2 fL (ref 80.0–100.0)
MONO ABS: 420 {cells}/uL (ref 200–950)
MPV: 10 fL (ref 7.5–12.5)
Monocytes Relative: 6 %
Neutro Abs: 4550 cells/uL (ref 1500–7800)
Neutrophils Relative %: 65 %
Platelets: 293 10*3/uL (ref 140–400)
RBC: 4.17 MIL/uL (ref 3.80–5.10)
RDW: 14.9 % (ref 11.0–15.0)
WBC: 7 10*3/uL (ref 3.8–10.8)

## 2016-09-11 NOTE — Progress Notes (Signed)
Subjective:    Patient ID: Regina Ray, female    DOB: Oct 28, 1946, 70 y.o.   MRN: [id number]  DOS:  09/11/2016 Type of visit - description : Acute Interval history: Chief complaint today is pain. On and off back pain, mostly in the lower back , occasional radiation to the left leg for a while. Also having aches and pains at around the shoulder, elbows, knees. Denies pain in the hands or wrists, hands are not red or swollen.   Review of Systems  No fever chills. No weight loss Occasional cough no shortness of breath. No headaches. Denies lower extremity paresthesias  Past Medical History:  Diagnosis Date  . Allergy   . Anxiety and depression   . Bronchitis   . DJD (degenerative joint disease)   . Fatty liver    per [place] 2008  . Gastroparesis    per EGD 2008  . HTN (hypertension)   . Hyperlipidemia     Past Surgical History:  Procedure Laterality Date  . ABIs normal  05-2009   for question of claudication  . APPENDECTOMY    . 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 Marital status: Married    Spouse name: N/A  . Number of children: 4  . Years of education: N/A   Occupational History  . stay home    Social History Main Topics  . Smoking status: Never Smoker  . Smokeless tobacco: Never Used  . Alcohol use No  . Drug use: No  . Sexual activity: Not on file   Other Topics Concern  . Not on file   Social History Narrative   Household-- pt, husband, 1 daughter    From [place]   Does not drive        Medication List       Accurate as of 09/11/16 11:59 PM. Always use your most recent med list.          amLODipine 5 MG tablet Commonly known as:  NORVASC Take 5 mg by mouth daily.   atorvastatin 40 MG tablet Commonly known as:  LIPITOR Take 40 mg by mouth daily.   CALCIUM & VIT D3 BONE HEALTH PO Take by mouth.   fish oil-omega-3 fatty acids 1000 MG capsule Take 2 g by mouth  daily.   FLUoxetine 20 MG tablet Commonly known as:  PROZAC Take 1 tablet (20 mg total) by mouth daily.   losartan 100 MG tablet Commonly known as:  COZAAR Take 100 mg by mouth daily.   naproxen 250 MG tablet Commonly known as:  NAPROSYN Take 500 mg by mouth daily as needed.   ranitidine 300 MG tablet Commonly known as:  ZANTAC Take 300 mg by mouth daily.          Objective:   Physical Exam BP 124/72 (BP Location: Left Arm, Patient Position: Sitting, Cuff Size: Normal)   Pulse 71   Temp 97.7 F (36.5 C) (Oral)   Resp 14   Ht 4\' 11"  (1.499 m)   Wt 181 lb (82.1 kg)   SpO2 98%   BMI 36.56 kg/m  General:   Well developed, well nourished . NAD.  HEENT:  Normocephalic . Face symmetric, atraumatic Lungs:  CTA B Normal respiratory effort, no intercostal retractions, no accessory muscle use. Heart: RRR,  no murmur.  No pretibial edema bilaterally  MSK: Hands and wrists without synovitis. Slightly TTP at the  upper thoracic spine more than the lower. Also slightly TTP at the left sacroiliac area. Skin: Not pale. Not jaundice Neurologic:  alert & oriented X3.  Speech normal, gait appropriate for age and unassisted. DTRs symmetric Psych--  Cognition and judgment appear intact.  Cooperative with normal attention span and concentration.  Behavior appropriate. No anxious or depressed appearing.      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 Intra-auricular communication ) Fatty liver 2008 ? Claudication: Normal ABIs 2010 DOE: saw cards >> 12/02/2015: Stress test low risk, echocardiogram reassuring (see report)  PLAN DJD, MSK:  back pain, multiple joints pain, reports a history of compression fractures?. In  the past she got local injections by Dr. Nelva Bush (2012).  Plan: X-ray thoracic and lumbar spine. Sedimentation rate, CK, ANA, rheumatoid factor Hold Lipitor. Bone density test. HTN: CBC and BMP Restrictive lung disease: Not taking Qvar d/t being essentially asx RTC 4 weeks.

## 2016-09-11 NOTE — Progress Notes (Signed)
Pre visit review using our clinic review tool, if applicable. No additional management support is needed unless otherwise documented below in the visit note. 

## 2016-09-11 NOTE — Patient Instructions (Signed)
GO TO THE LAB : Get the blood work     GO TO THE FRONT DESK Schedule your next appointment for a  routine checkup in 4 weeks     STOP BY THE FIRST FLOOR:  get the XR    Don't  take Lipitor for the next 4 weeks

## 2016-09-12 LAB — SEDIMENTATION RATE: Sed Rate: 10 mm/hr (ref 0–30)

## 2016-09-12 LAB — BASIC METABOLIC PANEL
BUN: 21 mg/dL (ref 7–25)
CHLORIDE: 104 mmol/L (ref 98–110)
CO2: 26 mmol/L (ref 20–31)
Calcium: 9.5 mg/dL (ref 8.6–10.4)
Creat: 0.74 mg/dL (ref 0.60–0.93)
GLUCOSE: 95 mg/dL (ref 65–99)
POTASSIUM: 4.6 mmol/L (ref 3.5–5.3)
Sodium: 140 mmol/L (ref 135–146)

## 2016-09-12 LAB — CK: Total CK: 73 U/L (ref 7–177)

## 2016-09-13 NOTE — Assessment & Plan Note (Signed)
PLAN DJD, MSK:  back pain, multiple joints pain, reports a history of compression fractures?. In the past she got local injections by Dr. Nelva Bush (2012).  Plan: X-ray thoracic and lumbar spine. Sedimentation rate, CK, ANA, rheumatoid factor Hold Lipitor. Bone density test. HTN: CBC and BMP Restrictive lung disease: Not taking Qvar d/t being essentially asx RTC 4 weeks.

## 2016-09-14 LAB — RHEUMATOID FACTOR: Rhuematoid fact SerPl-aCnc: <14 IU/mL (ref <14)

## 2016-09-14 LAB — ANA: Anti Nuclear Antibody(ANA): NEGATIVE

## 2016-09-18 ENCOUNTER — Telehealth: Payer: Self-pay | Admitting: Internal Medicine

## 2016-09-18 MED ORDER — LOSARTAN POTASSIUM 100 MG PO TABS: 100.0000 mg | ORAL_TABLET | Freq: Every day | ORAL | 6 refills | Status: DC

## 2016-09-18 NOTE — Telephone Encounter (Signed)
Okay refill for 6 months 

## 2016-09-18 NOTE — Telephone Encounter (Signed)
Caller name: Averianna  Relation to pt: self Call back number: [phone] Pharmacy: Suzie Portela from [facility]  Reason for call: Pt is requesting refill on losartan (COZAAR) 100 MG tablet. Please advise.

## 2016-09-18 NOTE — Telephone Encounter (Signed)
Rx sent 

## 2016-09-18 NOTE — Telephone Encounter (Signed)
Please advise, do not see where PCP has written script for Losartan before. Okay to refill?

## 2016-10-09 ENCOUNTER — Ambulatory Visit (INDEPENDENT_AMBULATORY_CARE_PROVIDER_SITE_OTHER): Payer: Medicare HMO | Admitting: Internal Medicine

## 2016-10-09 ENCOUNTER — Encounter: Payer: Self-pay | Admitting: Internal Medicine

## 2016-10-09 VITALS — BP 132/72 | HR 74 | Temp 98.2°F | Resp 14 | Ht 59.0 in | Wt 182.5 lb

## 2016-10-09 DIAGNOSIS — I10 Essential (primary) hypertension: Secondary | ICD-10-CM

## 2016-10-09 DIAGNOSIS — M2550 Pain in unspecified joint: Secondary | ICD-10-CM | POA: Diagnosis not present

## 2016-10-09 DIAGNOSIS — E785 Hyperlipidemia, unspecified: Secondary | ICD-10-CM

## 2016-10-09 NOTE — Patient Instructions (Signed)
  GO TO THE FRONT DESK Schedule your next appointment for a  Physical in 4 months, fasting

## 2016-10-09 NOTE — Progress Notes (Signed)
Subjective:    Patient ID: Regina Ray, female    DOB: May 24, 1946, 70 y.o.   MRN: [id number]  DOS:  10/09/2016 Type of visit - description : f/u, Here with her daughter. Interval history: Was seen recently with pain, labs were negative, she stopped Lipitor, pains are significantly better. Today reports she is slightly more anxious than usual, her daughter reports that she has not been taking SSRIs daily but rather on as-needed basis.   Review of Systems   Past Medical History:  Diagnosis Date  . Allergy   . Anxiety and depression   . Bronchitis   . DJD (degenerative joint disease)   . Fatty liver    per [place] 2008  . Gastroparesis    per EGD 2008  . HTN (hypertension)   . Hyperlipidemia     Past Surgical History:  Procedure Laterality Date  . ABIs normal  05-2009   for question of claudication  . APPENDECTOMY    . 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 Marital status: Married    Spouse name: N/A  . Number of children: 4  . Years of education: N/A   Occupational History  . stay home    Social History Main Topics  . Smoking status: Never Smoker  . Smokeless tobacco: Never Used  . Alcohol use No  . Drug use: No  . Sexual activity: Not on file   Other Topics Concern  . Not on file   Social History Narrative   Household-- pt, husband, 1 daughter    From [place]   Does not drive        Medication List       Accurate as of 10/09/16  2:34 PM. Always use your most recent med list.          amLODipine 5 MG tablet Commonly known as:  NORVASC Take 5 mg by mouth daily.   atorvastatin 40 MG tablet Commonly known as:  LIPITOR Take 40 mg by mouth daily.   CALCIUM & VIT D3 BONE HEALTH PO Take by mouth.   fish oil-omega-3 fatty acids 1000 MG capsule Take 2 g by mouth daily.   FLUoxetine 20 MG tablet Commonly known as:  PROZAC Take 1 tablet (20 mg total) by mouth daily.   losartan 100  MG tablet Commonly known as:  COZAAR Take 1 tablet (100 mg total) by mouth daily.   naproxen 250 MG tablet Commonly known as:  NAPROSYN Take 500 mg by mouth daily as needed.   ranitidine 300 MG tablet Commonly known as:  ZANTAC Take 300 mg by mouth daily.          Objective:   Physical Exam BP 132/72 (BP Location: Left Arm, Patient Position: Sitting, Cuff Size: Normal)   Pulse 74   Temp 98.2 F (36.8 C) (Oral)   Resp 14   Ht 4\' 11"  ([id number] m)   Wt 182 lb 8 oz (82.8 kg)   SpO2 98%   BMI 36.86 kg/m  General:   Well developed, well nourished . NAD.  HEENT:  Normocephalic . Face symmetric, atraumatic Lungs:  CTA B Normal respiratory effort, no intercostal retractions, no accessory muscle use. Heart: RRR,  no murmur.  No pretibial edema bilaterally  Skin: Not pale. Not jaundice Neurologic:  alert & oriented X3.  Speech normal, gait appropriate for age and unassisted Psych--  Cognition and judgment appear intact.  Cooperative with normal attention span and concentration.  Behavior appropriate. No anxious or depressed appearing.      Assessment & Plan:   Assessment  HTN Hyperlipidemia- Lipitor DC 10/2016 (aches ) Restrictive lung disease responsive to bronchodilators (last note available  from asthma-allergist 2014), used to be on Qvar Anxiety depression: had palpitations with sertraline, Lexapro (rx 10-2015 $$), rx prozac 11-2015 DJD Cardiac surgery ~ age 2 (? Intra-auricular communication ) Fatty liver 2008 ? Claudication: Normal ABIs 2010 DOE: saw cards >> 12/02/2015: Stress test low risk, echocardiogram reassuring (see report)  PLAN DJD, MSK: See last visit. Stopped Lipitor, pain has decreased. Blood work was negative. Back x-ray showed DJD. Bone density test pending. Recommend judicious use of OTCs to handle the pain. HTN: Well-controlled. Continue losartan and amlodipine High cholesterol: Stopped Lipitor, aches and pains decreased.  We'll get another FLP  in few months, ? different statin. RTC 4 months, CPX

## 2016-10-09 NOTE — Progress Notes (Signed)
Pre visit review using our clinic review tool, if applicable. No additional management support is needed unless otherwise documented below in the visit note. 

## 2016-10-10 NOTE — Assessment & Plan Note (Signed)
DJD, MSK: See last visit. Stopped Lipitor, pain has decreased. Blood work was negative. Back x-ray showed DJD. Bone density test pending. Recommend judicious use of OTCs to handle the pain. HTN: Well-controlled. Continue losartan and amlodipine High cholesterol: Stopped Lipitor, aches and pains decreased.  We'll get another FLP in few months, ? different statin. RTC 4 months, CPX

## 2016-11-20 ENCOUNTER — Encounter: Payer: Self-pay | Admitting: Gastroenterology

## 2016-12-10 ENCOUNTER — Telehealth: Payer: Self-pay | Admitting: Internal Medicine

## 2016-12-10 NOTE — Telephone Encounter (Signed)
Patient is requesting a refill of cholesterol medication. Please advise  Pharmacy: [place], [place].

## 2016-12-10 NOTE — Telephone Encounter (Signed)
Do not see where Pt is on a cholesterol medication. Per chart, Pt needs interpreter, will forward to Kennyth Lose to call once she returns to office.

## 2016-12-11 ENCOUNTER — Telehealth: Payer: Self-pay | Admitting: Internal Medicine

## 2016-12-11 MED ORDER — LOSARTAN POTASSIUM 100 MG PO TABS: 100.0000 mg | ORAL_TABLET | Freq: Every day | ORAL | 6 refills | Status: AC

## 2016-12-11 NOTE — Telephone Encounter (Signed)
Called pt twice and no answer, there was no answering machine to leave message.

## 2016-12-11 NOTE — Telephone Encounter (Signed)
Rx sent 

## 2016-12-11 NOTE — Telephone Encounter (Signed)
Caller name:Bubb,Claudia Relation to [phone]  Call back [place]:  [place], [place]. [phone] (Phone) [phone] (Fax)   Reason for call:  Daughter requesting a refill losartan (COZAAR) 100 MG tablet

## 2016-12-14 ENCOUNTER — Telehealth: Payer: Self-pay

## 2016-12-14 NOTE — Telephone Encounter (Signed)
Called patient regarding appointment being scheduled. Left message for return call.

## 2017-02-03 ENCOUNTER — Encounter: Payer: Self-pay | Admitting: Internal Medicine

## 2017-02-05 ENCOUNTER — Ambulatory Visit: Payer: Self-pay | Admitting: Medical

## 2017-02-08 ENCOUNTER — Ambulatory Visit: Payer: Self-pay | Admitting: Internal Medicine

## 2017-02-15 ENCOUNTER — Ambulatory Visit: Payer: Self-pay | Admitting: Medical

## 2017-02-16 ENCOUNTER — Telehealth: Payer: Self-pay | Admitting: Internal Medicine

## 2017-02-16 NOTE — Telephone Encounter (Signed)
Patient was No Show in this office on 4/6, 4/9, and 4/16. Charge or No Charge? 4/6 and 4/16 was with Percell Miller. 4/9 was with Dr. Larose Kells.

## 2017-02-16 NOTE — Telephone Encounter (Signed)
Charge. 

## 2017-02-17 ENCOUNTER — Encounter: Payer: Self-pay | Admitting: Internal Medicine

## 2017-02-17 ENCOUNTER — Telehealth: Payer: Self-pay | Admitting: Internal Medicine

## 2017-02-17 NOTE — Telephone Encounter (Signed)
charge 

## 2017-02-17 NOTE — Telephone Encounter (Signed)
Patient left message on voicemail this morning @ 8;10 AM stating she would not be at her 9:00 appointment. Charge or No Charge?

## 2017-02-25 ENCOUNTER — Ambulatory Visit: Payer: Self-pay | Admitting: Family Medicine

## 2017-03-01 ENCOUNTER — Ambulatory Visit (INDEPENDENT_AMBULATORY_CARE_PROVIDER_SITE_OTHER): Payer: Medicare HMO | Admitting: Family Medicine

## 2017-03-01 ENCOUNTER — Encounter: Payer: Self-pay | Admitting: Family Medicine

## 2017-03-01 VITALS — BP 136/60 | HR 66 | Temp 98.1°F | Ht 59.0 in | Wt 182.2 lb

## 2017-03-01 DIAGNOSIS — M47816 Spondylosis without myelopathy or radiculopathy, lumbar region: Secondary | ICD-10-CM

## 2017-03-01 NOTE — Progress Notes (Signed)
Pre visit review using our clinic review tool, if applicable. No additional management support is needed unless otherwise documented below in the visit note. 

## 2017-03-01 NOTE — Progress Notes (Signed)
Chief Complaint  Patient presents with  . Hip Pain    x 1 month (on and off) (B) but the (L) is worse  . Cough    since Dec (comes and goes)product-white    Subjective: Patient is a 71 y.o. female here for b/l hip pain (says hip but points to lower back), worse on the L. Her daughter is here to interpret.  B/l low back pain is chronic, worsened 1 mo ago. XR in note from 2017 showed DJD. Described as a sharp pain. Nothing makes it better. Movement makes it worse. No change in activity or injury. Pain radiates down leg on L. Her reg PCP recommended NSAIDs without relief. Has not tried ice or heat. She does not stretch at home. She has seen PT in the past, around 2 years ago but does not remember if it was helpful. No loss of bowel/bladder control.   ROS: Neuro: No numbness or tingling Msk: As noted in HPI  Family History  Problem Relation Age of Onset  . Breast cancer Mother     dx age 71  . CAD Other     GM  . Colon cancer Neg Hx   . Diabetes Neg Hx    Past Medical History:  Diagnosis Date  . Allergy   . Anxiety and depression   . Bronchitis   . DJD (degenerative joint disease)   . Fatty liver    per [place] 2008  . Gastroparesis    per EGD 2008  . HTN (hypertension)   . Hyperlipidemia    Allergies  Allergen Reactions  . Penicillins     REACTION: anaphylactic rxn -- sob -- swelling  . Sertraline Hcl     REACTION: palpitations    Current Outpatient Prescriptions:  .  amLODipine (NORVASC) 5 MG tablet, Take 5 mg by mouth daily., Disp: , Rfl:  .  fish oil-omega-3 fatty acids 1000 MG capsule, Take 2 g by mouth daily., Disp: , Rfl:  .  FLUoxetine (PROZAC) 20 MG tablet, Take 1 tablet (20 mg total) by mouth daily., Disp: 30 tablet, Rfl: 6 .  losartan (COZAAR) 100 MG tablet, Take 1 tablet (100 mg total) by mouth daily., Disp: 30 tablet, Rfl: 6 .  Multiple Minerals-Vitamins (CALCIUM & VIT D3 BONE HEALTH PO), Take by mouth., Disp: , Rfl:  .  naproxen (NAPROSYN) 250 MG tablet, Take  500 mg by mouth daily as needed., Disp: , Rfl:  .  ranitidine (ZANTAC) 300 MG tablet, Take 300 mg by mouth daily., Disp: , Rfl:   Objective: BP 136/60 (BP Location: Left Arm, Patient Position: Sitting, Cuff Size: Large)   Pulse 66   Temp 98.1 F (36.7 C) (Oral)   Ht 4\' 11"  (1.499 m)   Wt 182 lb 3.2 oz (82.6 kg)   SpO2 97%   BMI 36.80 kg/m  General: Awake, appears stated age Lungs: No accessory muscle use Neuro: No cerebellar signs, 2/4 patellar reflex b/l, 1/4 calcaneal reflex b/l MSK: +TTP over lumbar paraspinal musculature, poor hamstring ROM, neg straight leg, Lesegue's b/l, 5/5 strength throughout in LE, hip flexion did cause pain in Low back Psych: Age appropriate judgment and insight, normal affect and mood  Assessment and Plan: Spondylosis of lumbar region without myelopathy or radiculopathy  Home stretches and exercises given. Do what you can tolerate.  Offered PT given chronicity, will try home things first.  Tylenol. Heat. NSAIDs as directed by reg PCP. F/u as originally scheduled with reg PCP. The patient and  daughter voiced understanding and agreement to the plan.  [place], DO 03/01/17  10:11 AM

## 2017-03-01 NOTE — Patient Instructions (Addendum)
Yoga can also be helpful.   Tylenol 1000 mg every 6 hours as needed for pain, OK to use with ibuprofen.   EXERCISES  RANGE OF MOTION (ROM) AND STRETCHING EXERCISES - Low Back Pain Most people with lower back pain will find that their symptoms get worse with excessive bending forward (flexion) or arching at the lower back (extension). The exercises that will help resolve your symptoms will focus on the opposite motion.  Your physician, physical therapist or athletic trainer will help you determine which exercises will be most helpful to resolve your lower back pain. Do not complete any exercises without first consulting with your caregiver. Discontinue any exercises which make your symptoms worse, until you speak to your caregiver. If you have pain, numbness or tingling which travels down into your buttocks, leg or foot, the goal of the therapy is for these symptoms to move closer to your back and eventually resolve. Sometimes, these leg symptoms will get better, but your lower back pain may worsen. This is often an indication of progress in your rehabilitation. Be very alert to any changes in your symptoms and the activities in which you participated in the 24 hours prior to the change. Sharing this information with your caregiver will allow him or her to most efficiently treat your condition. These exercises may help you when beginning to rehabilitate your injury. Your symptoms may resolve with or without further involvement from your physician, physical therapist or athletic trainer. While completing these exercises, remember:   Restoring tissue flexibility helps normal motion to return to the joints. This allows healthier, less painful movement and activity.  An effective stretch should be held for at least 30 seconds.  A stretch should never be painful. You should only feel a gentle lengthening or release in the stretched tissue. FLEXION RANGE OF MOTION AND STRETCHING EXERCISES:  STRETCH -  Flexion, Single Knee to Chest   Lie on a firm bed or floor with both legs extended in front of you.  Keeping one leg in contact with the floor, bring your opposite knee to your chest. Hold your leg in place by either grabbing behind your thigh or at your knee.  Pull until you feel a gentle stretch in your low back. Hold 15-20 seconds.  Slowly release your grasp and repeat the exercise with the opposite side. Repeat 2 times. Complete this exercise 1-2 times per day.   STRETCH - Flexion, Double Knee to Chest  Lie on a firm bed or floor with both legs extended in front of you.  Keeping one leg in contact with the floor, bring your opposite knee to your chest.  Tense your stomach muscles to support your back and then lift your other knee to your chest. Hold your legs in place by either grabbing behind your thighs or at your knees.  Pull both knees toward your chest until you feel a gentle stretch in your low back. Hold 15-20 seconds.  Tense your stomach muscles and slowly return one leg at a time to the floor. Repeat 2 times. Complete this exercise 1-2 times per day.   STRETCH - Low Trunk Rotation  Lie on a firm bed or floor. Keeping your legs in front of you, bend your knees so they are both pointed toward the ceiling and your feet are flat on the floor.  Extend your arms out to the side. This will stabilize your upper body by keeping your shoulders in contact with the floor.  Gently and slowly  drop both knees together to one side until you feel a gentle stretch in your low back. Hold for 15-20 seconds.  Tense your stomach muscles to support your lower back as you bring your knees back to the starting position. Repeat the exercise to the other side. Repeat 2 times. Complete this exercise 1-2 times per day  EXTENSION RANGE OF MOTION AND FLEXIBILITY EXERCISES:  STRETCH - Extension, Prone on Elbows   Lie on your stomach on the floor, a bed will be too soft. Place your palms about  shoulder width apart and at the height of your head.  Place your elbows under your shoulders. If this is too painful, stack pillows under your chest.  Allow your body to relax so that your hips drop lower and make contact more completely with the floor.  Hold this position for 15-20 seconds.  Slowly return to lying flat on the floor. Repeat 2 times. Complete this exercise 1-2 times per day.   RANGE OF MOTION - Extension, Prone Press Ups  Lie on your stomach on the floor, a bed will be too soft. Place your palms about shoulder width apart and at the height of your head.  Keeping your back as relaxed as possible, slowly straighten your elbows while keeping your hips on the floor. You may adjust the placement of your hands to maximize your comfort. As you gain motion, your hands will come more underneath your shoulders.  Hold this position 15-20 seconds.  Slowly return to lying flat on the floor. Repeat 2 times. Complete this exercise 1-2 times per day.   RANGE OF MOTION- Quadruped, Neutral Spine   Assume a hands and knees position on a firm surface. Keep your hands under your shoulders and your knees under your hips. You may place padding under your knees for comfort.  Drop your head and point your tailbone toward the ground below you. This will round out your lower back like an angry cat. Hold this position for 15-20 seconds.  Slowly lift your head and release your tail bone so that your back sags into a large arch, like an old horse.  Hold this position for 15-20 seconds.  Repeat this until you feel limber in your low back.  Now, find your "sweet spot." This will be the most comfortable position somewhere between the two previous positions. This is your neutral spine. Once you have found this position, tense your stomach muscles to support your low back.  Hold this position for 15-20 seconds. Repeat 2 times. Complete this exercise 1-2 times per day.  STRENGTHENING EXERCISES -  Low Back Sprain These exercises may help you when beginning to rehabilitate your injury. These exercises should be done near your "sweet spot." This is the neutral, low-back arch, somewhere between fully rounded and fully arched, that is your least painful position. When performed in this safe range of motion, these exercises can be used for people who have either a flexion or extension based injury. These exercises may resolve your symptoms with or without further involvement from your physician, physical therapist or athletic trainer. While completing these exercises, remember:   Muscles can gain both the endurance and the strength needed for everyday activities through controlled exercises.  Complete these exercises as instructed by your physician, physical therapist or athletic trainer. Increase the resistance and repetitions only as guided.  You may experience muscle soreness or fatigue, but the pain or discomfort you are trying to eliminate should never worsen during these exercises. If  this pain does worsen, stop and make certain you are following the directions exactly. If the pain is still present after adjustments, discontinue the exercise until you can discuss the trouble with your caregiver.  STRENGTHENING - Deep Abdominals, Pelvic Tilt   Lie on a firm bed or floor. Keeping your legs in front of you, bend your knees so they are both pointed toward the ceiling and your feet are flat on the floor.  Tense your lower abdominal muscles to press your low back into the floor. This motion will rotate your pelvis so that your tail bone is scooping upwards rather than pointing at your feet or into the floor. With a gentle tension and even breathing, hold this position for 10-15 seconds. Repeat 2 times. Complete this exercise 1 time per day.   STRENGTHENING - Abdominals, Crunches   Lie on a firm bed or floor. Keeping your legs in front of you, bend your knees so they are both pointed toward the  ceiling and your feet are flat on the floor. Cross your arms over your chest.  Slightly tip your chin down without bending your neck.  Tense your abdominals and slowly lift your trunk high enough to just clear your shoulder blades. Lifting higher can put excessive stress on the lower back and does not further strengthen your abdominal muscles.  Control your return to the starting position. Repeat 2 times. Complete this exercise once every 1-2 days.   STRENGTHENING - Quadruped, Opposite UE/LE Lift   Assume a hands and knees position on a firm surface. Keep your hands under your shoulders and your knees under your hips. You may place padding under your knees for comfort.  Find your neutral spine and gently tense your abdominal muscles so that you can maintain this position. Your shoulders and hips should form a rectangle that is parallel with the floor and is not twisted.  Keeping your trunk steady, lift your right hand no higher than your shoulder and then your left leg no higher than your hip. Make sure you are not holding your breath. Hold this position for 15-20 seconds.  Continuing to keep your abdominal muscles tense and your back steady, slowly return to your starting position. Repeat with the opposite arm and leg. Repeat 2 times. Complete this exercise once every 1-2 days.   STRENGTHENING - Abdominals and Quadriceps, Straight Leg Raise   Lie on a firm bed or floor with both legs extended in front of you.  Keeping one leg in contact with the floor, bend the other knee so that your foot can rest flat on the floor.  Find your neutral spine, and tense your abdominal muscles to maintain your spinal position throughout the exercise.  Slowly lift your straight leg off the floor about 6 inches for a count of 15, making sure to not hold your breath.  Still keeping your neutral spine, slowly lower your leg all the way to the floor. Repeat this exercise with each leg 2 times. Complete this  exercise once every 1-2 days. POSTURE AND BODY MECHANICS CONSIDERATIONS - Low Back Sprain Keeping correct posture when sitting, standing or completing your activities will reduce the stress put on different body tissues, allowing injured tissues a chance to heal and limiting painful experiences. The following are general guidelines for improved posture. Your physician or physical therapist will provide you with any instructions specific to your needs. While reading these guidelines, remember:  The exercises prescribed by your provider will help you have  the flexibility and strength to maintain correct postures.  The correct posture provides the best environment for your joints to work. All of your joints have less wear and tear when properly supported by a spine with good posture. This means you will experience a healthier, less painful body.  Correct posture must be practiced with all of your activities, especially prolonged sitting and standing. Correct posture is as important when doing repetitive low-stress activities (typing) as it is when doing a single heavy-load activity (lifting).  RESTING POSITIONS Consider which positions are most painful for you when choosing a resting position. If you have pain with flexion-based activities (sitting, bending, stooping, squatting), choose a position that allows you to rest in a less flexed posture. You would want to avoid curling into a fetal position on your side. If your pain worsens with extension-based activities (prolonged standing, working overhead), avoid resting in an extended position such as sleeping on your stomach. Most people will find more comfort when they rest with their spine in a more neutral position, neither too rounded nor too arched. Lying on a non-sagging bed on your side with a pillow between your knees, or on your back with a pillow under your knees will often provide some relief. Keep in mind, being in any one position for a prolonged  period of time, no matter how correct your posture, can still lead to stiffness. PROPER SITTING POSTURE In order to minimize stress and discomfort on your spine, you must sit with correct posture. Sitting with good posture should be effortless for a healthy body. Returning to good posture is a gradual process. Many people can work toward this most comfortably by using various supports until they have the flexibility and strength to maintain this posture on their own. When sitting with proper posture, your ears will fall over your shoulders and your shoulders will fall over your hips. You should use the back of the chair to support your upper back. Your lower back will be in a neutral position, just slightly arched. You may place a small pillow or folded towel at the base of your lower back for  support.  When working at a desk, create an environment that supports good, upright posture. Without extra support, muscles tire, which leads to excessive strain on joints and other tissues. Keep these recommendations in mind:  CHAIR:  A chair should be able to slide under your desk when your back makes contact with the back of the chair. This allows you to work closely.  The chair's height should allow your eyes to be level with the upper part of your monitor and your hands to be slightly lower than your elbows.  BODY POSITION  Your feet should make contact with the floor. If this is not possible, use a foot rest.  Keep your ears over your shoulders. This will reduce stress on your neck and low back.  INCORRECT SITTING POSTURES  If you are feeling tired and unable to assume a healthy sitting posture, do not slouch or slump. This puts excessive strain on your back tissues, causing more damage and pain. Healthier options include:  Using more support, like a lumbar pillow.  Switching tasks to something that requires you to be upright or walking.  Talking a brief walk.  Lying down to rest in a  neutral-spine position.  PROLONGED STANDING WHILE SLIGHTLY LEANING FORWARD  When completing a task that requires you to lean forward while standing in one place for a long time,  place either foot up on a stationary 2-4 inch high object to help maintain the best posture. When both feet are on the ground, the lower back tends to lose its slight inward curve. If this curve flattens (or becomes too large), then the back and your other joints will experience too much stress, tire more quickly, and can cause pain.  CORRECT STANDING POSTURES Proper standing posture should be assumed with all daily activities, even if they only take a few moments, like when brushing your teeth. As in sitting, your ears should fall over your shoulders and your shoulders should fall over your hips. You should keep a slight tension in your abdominal muscles to brace your spine. Your tailbone should point down to the ground, not behind your body, resulting in an over-extended swayback posture.   INCORRECT STANDING POSTURES  Common incorrect standing postures include a forward head, locked knees and/or an excessive swayback. WALKING Walk with an upright posture. Your ears, shoulders and hips should all line-up.  PROLONGED ACTIVITY IN A FLEXED POSITION When completing a task that requires you to bend forward at your waist or lean over a low surface, try to find a way to stabilize 3 out of 4 of your limbs. You can place a hand or elbow on your thigh or rest a knee on the surface you are reaching across. This will provide you more stability, so that your muscles do not tire as quickly. By keeping your knees relaxed, or slightly bent, you will also reduce stress across your lower back. CORRECT LIFTING TECHNIQUES  DO :  Assume a wide stance. This will provide you more stability and the opportunity to get as close as possible to the object which you are lifting.  Tense your abdominals to brace your spine. Bend at the knees and  hips. Keeping your back locked in a neutral-spine position, lift using your leg muscles. Lift with your legs, keeping your back straight.  Test the weight of unknown objects before attempting to lift them.  Try to keep your elbows locked down at your sides in order get the best strength from your shoulders when carrying an object.     Always ask for help when lifting heavy or awkward objects. INCORRECT LIFTING TECHNIQUES DO NOT:   Lock your knees when lifting, even if it is a small object.  Bend and twist. Pivot at your feet or move your feet when needing to change directions.  Assume that you can safely pick up even a paperclip without proper posture.

## 2017-03-10 ENCOUNTER — Other Ambulatory Visit: Payer: Self-pay | Admitting: Internal Medicine

## 2017-03-10 DIAGNOSIS — Z1231 Encounter for screening mammogram for malignant neoplasm of breast: Secondary | ICD-10-CM

## 2017-03-12 ENCOUNTER — Encounter: Payer: Self-pay | Admitting: Internal Medicine

## 2017-03-12 ENCOUNTER — Ambulatory Visit (INDEPENDENT_AMBULATORY_CARE_PROVIDER_SITE_OTHER): Payer: Medicare HMO | Admitting: Internal Medicine

## 2017-03-12 ENCOUNTER — Encounter: Payer: Self-pay | Admitting: Gastroenterology

## 2017-03-12 VITALS — BP 128/72 | HR 70 | Temp 98.3°F | Resp 14 | Ht 59.0 in | Wt 179.0 lb

## 2017-03-12 DIAGNOSIS — M4800 Spinal stenosis, site unspecified: Secondary | ICD-10-CM | POA: Diagnosis not present

## 2017-03-12 DIAGNOSIS — F329 Major depressive disorder, single episode, unspecified: Secondary | ICD-10-CM

## 2017-03-12 DIAGNOSIS — Z0000 Encounter for general adult medical examination without abnormal findings: Secondary | ICD-10-CM

## 2017-03-12 DIAGNOSIS — I10 Essential (primary) hypertension: Secondary | ICD-10-CM | POA: Diagnosis not present

## 2017-03-12 DIAGNOSIS — R399 Unspecified symptoms and signs involving the genitourinary system: Secondary | ICD-10-CM

## 2017-03-12 DIAGNOSIS — Z13820 Encounter for screening for osteoporosis: Secondary | ICD-10-CM

## 2017-03-12 DIAGNOSIS — R1011 Right upper quadrant pain: Secondary | ICD-10-CM | POA: Diagnosis not present

## 2017-03-12 DIAGNOSIS — E785 Hyperlipidemia, unspecified: Secondary | ICD-10-CM

## 2017-03-12 DIAGNOSIS — F32A Depression, unspecified: Secondary | ICD-10-CM

## 2017-03-12 DIAGNOSIS — R5382 Chronic fatigue, unspecified: Secondary | ICD-10-CM | POA: Diagnosis not present

## 2017-03-12 DIAGNOSIS — R3915 Urgency of urination: Secondary | ICD-10-CM | POA: Diagnosis not present

## 2017-03-12 DIAGNOSIS — F419 Anxiety disorder, unspecified: Secondary | ICD-10-CM

## 2017-03-12 DIAGNOSIS — Z0001 Encounter for general adult medical examination with abnormal findings: Secondary | ICD-10-CM | POA: Diagnosis not present

## 2017-03-12 DIAGNOSIS — Z01419 Encounter for gynecological examination (general) (routine) without abnormal findings: Secondary | ICD-10-CM

## 2017-03-12 DIAGNOSIS — R69 Illness, unspecified: Secondary | ICD-10-CM | POA: Diagnosis not present

## 2017-03-12 DIAGNOSIS — Z1231 Encounter for screening mammogram for malignant neoplasm of breast: Secondary | ICD-10-CM

## 2017-03-12 DIAGNOSIS — Z1211 Encounter for screening for malignant neoplasm of colon: Secondary | ICD-10-CM

## 2017-03-12 NOTE — Progress Notes (Signed)
Pre visit review using our clinic review tool, if applicable. No additional management support is needed unless otherwise documented below in the visit note. 

## 2017-03-12 NOTE — Progress Notes (Signed)
Subjective:    Patient ID: Regina Ray, female    DOB: May 01, 1946, 71 y.o.   MRN: [id number]  DOS:  03/12/2017 Type of visit - description : cpx, here w/ g-daughter Interval history: In addition to a CPX, we managed her chronic medical problems.  Also has multiple complaints. See below Compliance with medications: States that she takes the medications  sporadically.  Wt Readings from Last 3 Encounters:  03/12/17 179 lb (81.2 kg)  03/01/17 182 lb 3.2 oz (82.6 kg)  10/09/16 182 lb 8 oz (82.8 kg)    Review of Systems Occasional cough and sputum production but no wheezing. Occasional chills, denies fevers or weight loss. Occasional palpitations, no syncope. Complain of fatigue and feeling sleepy throughout the day. Complaining of pain at the back and lower extremities ( throughout the legs, front-back-proximal-distal) with walking. Occasional urinary urgency and frequency. No dysuria or gross hematuria. Occasionally postprandial abdominal pain, right upper quadrant, no nausea, vomiting, diarrhea or blood in the stools. No heartburn per se. She has complained about these symptoms on and off throughout the years. Apparently they are no worse or better than before but nevertheless concerned about them   Other than above, a 14 point review of systems is negative      Past Medical History:  Diagnosis Date  . Allergy   . Anxiety and depression   . Bronchitis   . DJD (degenerative joint disease)   . Fatty liver    per [place] 2008  . Gastroparesis    per EGD 2008  . HTN (hypertension)   . Hyperlipidemia     Past Surgical History:  Procedure Laterality Date  . ABIs normal  05-2009   for question of claudication  . APPENDECTOMY    . 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 Marital status: Married    Spouse name: N/A  . Number of children: 4  . Years of education: N/A   Occupational History  . stay home     Social History Main Topics  . Smoking status: Never Smoker  . Smokeless tobacco: Never Used  . Alcohol use No  . Drug use: No  . Sexual activity: Not on file   Other Topics Concern  . Not on file   Social History Narrative   Household-- pt, husband, 1 daughter    From [place]   Does not drive     Family History  Problem Relation Age of Onset  . Breast cancer Mother        dx age 7  . CAD Other        GM  . Colon cancer Neg Hx   . Diabetes Neg Hx      Allergies as of 03/12/2017      Reactions   Penicillins    REACTION: anaphylactic rxn -- sob -- swelling   Sertraline Hcl    REACTION: palpitations      Medication List       Accurate as of 03/12/17 11:59 PM. Always use your most recent med list.          amLODipine 5 MG tablet Commonly known as:  NORVASC Take 5 mg by mouth daily.   CALCIUM & VIT D3 BONE HEALTH PO Take by mouth.   fish oil-omega-3 fatty acids 1000 MG capsule Take 2 g by mouth daily.   FLUoxetine 20 MG tablet Commonly known as:  PROZAC  Take 1 tablet (20 mg total) by mouth daily.   losartan 100 MG tablet Commonly known as:  COZAAR Take 1 tablet (100 mg total) by mouth daily.   naproxen 250 MG tablet Commonly known as:  NAPROSYN Take 500 mg by mouth daily as needed.   ranitidine 300 MG tablet Commonly known as:  ZANTAC Take 300 mg by mouth daily.          Objective:   Physical Exam BP 128/72 (BP Location: Left Wrist, Patient Position: Sitting, Cuff Size: Small)   Pulse 70   Temp 98.3 F (36.8 C) (Oral)   Resp 14   Ht 4\' 11"  (1.499 m)   Wt 179 lb (81.2 kg)   SpO2 97%   BMI 36.15 kg/m   General:   Well developed, well nourished . NAD.  Neck: No  thyromegaly  HEENT:  Normocephalic . Face symmetric, atraumatic Lungs:  CTA B Normal respiratory effort, no intercostal retractions, no accessory muscle use. Heart: RRR,  no murmur.  No pretibial edema bilaterally . Normal femoral and pedal pulses Abdomen:  Not  distended, soft, non-tender. No rebound or rigidity.   Skin: Exposed areas without rash. Not pale. Not jaundice Neurologic:  alert & oriented X3.  Speech normal, gait appropriate for age and unassisted Strength symmetric and appropriate for age.  Psych: Cognition and judgment appear intact.  Cooperative with normal attention span and concentration.  Behavior appropriate. No anxious or depressed appearing.    Assessment & Plan:   Assessment  HTN Hyperlipidemia- Lipitor DC 10/2016 (aches ) Restrictive lung disease responsive to bronchodilators (last note available  from asthma-allergist 2014), used to be on Qvar Anxiety depression: had palpitations with sertraline, Lexapro (rx 10-2015 $$), rx prozac 11-2015 DJD Cardiac surgery ~ age 62 (? Intra-auricular communication ) Fatty liver 2008 ? Claudication: Normal ABIs 2010 DOE: saw cards >> 12/02/2015: Stress test low risk, echocardiogram reassuring (see report)  PLAN HTN: On amlodipine and losartan, apparently not taking meds qd, BP today is okay, check labs Hyperlipidemia: States is taking "a statin", I don't see that she has been prescribed a statin recently by me. Check a FLP Anxiety depression: Takes fluoxetine sometimes depending on how she feels, encouraged to take it daily Fatigue: Patient is obese, history of restrictive lung disease, DDX is large but includes a sleep apnea however epworth scale scored 2 (negative). Get TSH, X54, folic acid, vitamin D. Urinary urgency check a UA and urine culture Abdominal pain: Right upper quadrant, postprandial, apparently chronic. No recent US, will get ultrasound DJD, pain: Back and lower extremity pain with walking, had normal ABIs 2013, femoral and pedal pulses normal. Could be spinal stenosis. Refer to orthopedic. Is hard to distinguish (even when I interview her in [place]) if chronic symptoms are worse or she simply is reporting them  RTC 3 months to reassess multiple sx    Today, I  spent more than 35 min with the patient: >50% of the time counseling regards her multiple symptoms, declined to take a detail history and reviewing old records.

## 2017-03-12 NOTE — Patient Instructions (Signed)
  GO TO THE FRONT DESK Schedule your next appointment for a follow-up in 3 months  Schedule labs to be done next week, fasting

## 2017-03-12 NOTE — Assessment & Plan Note (Addendum)
--  Td-2015;  pnm 23--2015;  prevnar 2014; zostavax rx provided before, failed to get -- Cscope--hyperplastic polyp 01/10/2007, , dr Sharlett Iles, refer to GI --Female care: s/p PAP 02/26/2015, Dr. Matthew Saras, refer to gyn again [facility] and DEXA  Schedule for 03-26-17 --Diet and exercise discussed

## 2017-03-14 NOTE — Assessment & Plan Note (Addendum)
HTN: On amlodipine and losartan, apparently not taking meds qd, BP today is okay, check labs Hyperlipidemia: States is taking "a statin", I don't see that she has been prescribed a statin recently by me. Check a FLP Anxiety depression: Takes fluoxetine sometimes depending on how she feels, encouraged to take it daily Fatigue: Patient is obese, history of restrictive lung disease, DDX is large but includes a sleep apnea however epworth scale scored 2 (negative). Get TSH, L57, folic acid, vitamin D. Urinary urgency check a UA and urine culture Abdominal pain: Right upper quadrant, postprandial, apparently chronic. No recent US, will get ultrasound DJD, pain: Back and lower extremity pain with walking, had normal ABIs 2013, femoral and pedal pulses normal. Could be spinal stenosis. Refer to orthopedic. Is hard to distinguish (even when I interview her in [place]) if chronic symptoms are worse or she simply is reporting them  RTC 3 months to reassess multiple sx

## 2017-03-15 ENCOUNTER — Other Ambulatory Visit (INDEPENDENT_AMBULATORY_CARE_PROVIDER_SITE_OTHER): Payer: Medicare HMO

## 2017-03-15 DIAGNOSIS — R5382 Chronic fatigue, unspecified: Secondary | ICD-10-CM | POA: Diagnosis not present

## 2017-03-15 DIAGNOSIS — R399 Unspecified symptoms and signs involving the genitourinary system: Secondary | ICD-10-CM | POA: Diagnosis not present

## 2017-03-15 DIAGNOSIS — Z13820 Encounter for screening for osteoporosis: Secondary | ICD-10-CM | POA: Diagnosis not present

## 2017-03-15 DIAGNOSIS — Z0000 Encounter for general adult medical examination without abnormal findings: Secondary | ICD-10-CM

## 2017-03-15 LAB — COMPREHENSIVE METABOLIC PANEL
ALT: 13 U/L (ref 0–35)
AST: 17 U/L (ref 0–37)
Albumin: 4 g/dL (ref 3.5–5.2)
Alkaline Phosphatase: 66 U/L (ref 39–117)
BUN: 25 mg/dL — AB (ref 6–23)
CHLORIDE: 103 meq/L (ref 96–112)
CO2: 29 mEq/L (ref 19–32)
Calcium: 9 mg/dL (ref 8.4–10.5)
Creatinine, Ser: 0.74 mg/dL (ref 0.40–1.20)
GFR: 81.88 mL/min (ref >60.00)
GLUCOSE: 95 mg/dL (ref 70–99)
POTASSIUM: 4.2 meq/L (ref 3.5–5.1)
SODIUM: 138 meq/L (ref 135–145)
Total Bilirubin: 0.8 mg/dL (ref 0.2–1.2)
Total Protein: 6.9 g/dL (ref 6.0–8.3)

## 2017-03-15 LAB — URINALYSIS, ROUTINE W REFLEX MICROSCOPIC
Bilirubin Urine: NEGATIVE
HGB URINE DIPSTICK: NEGATIVE
Ketones, ur: NEGATIVE
Nitrite: NEGATIVE
SPECIFIC GRAVITY, URINE: 1.015 (ref 1.000–1.030)
Total Protein, Urine: NEGATIVE
UROBILINOGEN UA: 0.2 (ref 0.0–1.0)
Urine Glucose: NEGATIVE
pH: 6 (ref 5.0–8.0)

## 2017-03-15 LAB — LIPID PANEL
CHOLESTEROL: 154 mg/dL (ref 0–200)
HDL: 44.3 mg/dL (ref >39.00)
LDL CALC: 87 mg/dL (ref 0–99)
NonHDL: 109.58
Total CHOL/HDL Ratio: 3
Triglycerides: 112 mg/dL (ref 0.0–149.0)
VLDL: 22.4 mg/dL (ref 0.0–40.0)

## 2017-03-15 LAB — CBC WITH DIFFERENTIAL/PLATELET
BASOS PCT: 0.6 % (ref 0.0–3.0)
Basophils Absolute: 0 10*3/uL (ref 0.0–0.1)
Eosinophils Absolute: 0.1 10*3/uL (ref 0.0–0.7)
Eosinophils Relative: 1.8 % (ref 0.0–5.0)
HCT: 37.2 % (ref 36.0–46.0)
HEMOGLOBIN: 12.1 g/dL (ref 12.0–15.0)
LYMPHS ABS: 1.6 10*3/uL (ref 0.7–4.0)
Lymphocytes Relative: 27.9 % (ref 12.0–46.0)
MCHC: 32.5 g/dL (ref 30.0–36.0)
MCV: 89.4 fl (ref 78.0–100.0)
MONO ABS: 0.4 10*3/uL (ref 0.1–1.0)
Monocytes Relative: 6.8 % (ref 3.0–12.0)
NEUTROS PCT: 62.9 % (ref 43.0–77.0)
Neutro Abs: 3.6 10*3/uL (ref 1.4–7.7)
Platelets: 259 10*3/uL (ref 150.0–400.0)
RBC: 4.16 Mil/uL (ref 3.87–5.11)
RDW: 14.6 % (ref 11.5–15.5)
WBC: 5.8 10*3/uL (ref 4.0–10.5)

## 2017-03-15 LAB — TSH: TSH: 1.59 u[IU]/mL (ref 0.35–4.50)

## 2017-03-15 LAB — FOLATE: Folate: >24 ng/mL (ref >5.9)

## 2017-03-15 LAB — VITAMIN B12: Vitamin B-12: 538 pg/mL (ref 211–911)

## 2017-03-16 LAB — URINE CULTURE

## 2017-03-17 LAB — VITAMIN D 1,25 DIHYDROXY
VITAMIN D 1, 25 (OH) TOTAL: 51 pg/mL (ref 18–72)
Vitamin D3 1, 25 (OH)2: 51 pg/mL

## 2017-03-26 ENCOUNTER — Ambulatory Visit
Admission: RE | Admit: 2017-03-26 | Discharge: 2017-03-26 | Disposition: A | Discharge status: Home / Self Care | Payer: Medicare HMO | Source: Ambulatory Visit | Attending: Internal Medicine | Admitting: Internal Medicine

## 2017-03-26 DIAGNOSIS — M85832 Other specified disorders of bone density and structure, left forearm: Secondary | ICD-10-CM | POA: Diagnosis not present

## 2017-03-26 DIAGNOSIS — Z1231 Encounter for screening mammogram for malignant neoplasm of breast: Secondary | ICD-10-CM

## 2017-03-26 DIAGNOSIS — Z780 Asymptomatic menopausal state: Secondary | ICD-10-CM | POA: Diagnosis not present

## 2017-04-02 ENCOUNTER — Ambulatory Visit (HOSPITAL_BASED_OUTPATIENT_CLINIC_OR_DEPARTMENT_OTHER)
Admission: RE | Admit: 2017-04-02 | Discharge: 2017-04-02 | Disposition: A | Discharge status: Home / Self Care | Payer: Medicare HMO | Source: Ambulatory Visit | Attending: Internal Medicine | Admitting: Internal Medicine

## 2017-04-02 ENCOUNTER — Encounter: Payer: Self-pay | Admitting: Internal Medicine

## 2017-04-02 DIAGNOSIS — K769 Liver disease, unspecified: Secondary | ICD-10-CM | POA: Insufficient documentation

## 2017-04-02 DIAGNOSIS — R1011 Right upper quadrant pain: Secondary | ICD-10-CM | POA: Diagnosis not present

## 2017-05-21 ENCOUNTER — Encounter: Payer: Self-pay | Admitting: Gastroenterology

## 2017-06-02 ENCOUNTER — Ambulatory Visit: Payer: Self-pay | Admitting: Internal Medicine

## 2017-06-02 ENCOUNTER — Telehealth: Payer: Self-pay

## 2017-06-02 ENCOUNTER — Encounter: Payer: Self-pay | Admitting: Gastroenterology

## 2017-06-02 DIAGNOSIS — Z0289 Encounter for other administrative examinations: Secondary | ICD-10-CM

## 2017-06-02 NOTE — Telephone Encounter (Signed)
7 no shows in a year!. Start dismissal process No charge for today last minute cancellation

## 2017-06-02 NOTE — Telephone Encounter (Signed)
Multiple no shows/cancellations:   No show: 06/02/2017- acute visit w/ Paz No show: 02/25/2017-acute visit w/ [place]- cramps No show: 02/17/2017- CPE w/ Paz  No show: 02/15/2017-acute visit w/ Edward-sciatic nerve No show: 02/08/2017- acute visit w/ Paz- cramping No show: 02/05/2017-acute w/ Edward-cramping Cancel: 02/03/2017-CPE w/ Paz No Show: 06/05/2016- Follow-up w/ Larose Kells  Would you like to begin dismissal process?

## 2017-06-02 NOTE — Telephone Encounter (Signed)
Dismissal Letter printed. Forwarded to [place], Engineer, building services.

## 2017-06-02 NOTE — Telephone Encounter (Signed)
Daughter cancelled patient 8/1  Sciatic nerve pain appointment today due to her transportation cancelling on her, charge or no charge

## 2017-06-04 ENCOUNTER — Telehealth: Payer: Self-pay | Admitting: Internal Medicine

## 2017-06-04 ENCOUNTER — Ambulatory Visit: Payer: Self-pay | Admitting: Internal Medicine

## 2017-06-04 NOTE — Telephone Encounter (Signed)
Patient dismissed from [facility] by Kathlene November MD , effective June 02, 2017. Dismissal letter sent out by certified / registered mail.  daj

## 2017-06-07 ENCOUNTER — Ambulatory Visit: Payer: Self-pay | Admitting: Internal Medicine

## 2017-06-07 DIAGNOSIS — Z0289 Encounter for other administrative examinations: Secondary | ICD-10-CM

## 2017-06-11 ENCOUNTER — Encounter: Payer: Self-pay | Admitting: Internal Medicine

## 2017-06-11 ENCOUNTER — Ambulatory Visit (INDEPENDENT_AMBULATORY_CARE_PROVIDER_SITE_OTHER): Payer: Medicare HMO | Admitting: Internal Medicine

## 2017-06-11 ENCOUNTER — Ambulatory Visit (HOSPITAL_BASED_OUTPATIENT_CLINIC_OR_DEPARTMENT_OTHER)
Admission: RE | Admit: 2017-06-11 | Discharge: 2017-06-11 | Disposition: A | Discharge status: Home / Self Care | Payer: Medicare HMO | Source: Ambulatory Visit | Attending: Internal Medicine | Admitting: Internal Medicine

## 2017-06-11 VITALS — BP 126/74 | HR 67 | Temp 97.4°F | Resp 14 | Ht 59.0 in | Wt 178.0 lb

## 2017-06-11 DIAGNOSIS — M545 Low back pain: Secondary | ICD-10-CM | POA: Diagnosis not present

## 2017-06-11 DIAGNOSIS — M5440 Lumbago with sciatica, unspecified side: Secondary | ICD-10-CM | POA: Diagnosis not present

## 2017-06-11 DIAGNOSIS — M419 Scoliosis, unspecified: Secondary | ICD-10-CM | POA: Diagnosis not present

## 2017-06-11 DIAGNOSIS — I700 Atherosclerosis of aorta: Secondary | ICD-10-CM | POA: Insufficient documentation

## 2017-06-11 DIAGNOSIS — D259 Leiomyoma of uterus, unspecified: Secondary | ICD-10-CM | POA: Insufficient documentation

## 2017-06-11 MED ORDER — CYCLOBENZAPRINE HCL 10 MG PO TABS: 10.0000 mg | ORAL_TABLET | Freq: Every evening | ORAL | 0 refills | Status: AC | PRN

## 2017-06-11 MED ORDER — PREDNISONE 10 MG PO TABS: ORAL_TABLET | ORAL | 0 refills | Status: DC

## 2017-06-11 MED ORDER — KETOROLAC TROMETHAMINE 10 MG PO TABS: 10.0000 mg | ORAL_TABLET | Freq: Four times a day (QID) | ORAL | 0 refills | Status: AC | PRN

## 2017-06-11 NOTE — Patient Instructions (Signed)
Get the x-ray  Stop naproxen  Take prednisone for a few days as prescribed  -Take ketorolac as needed for pain,  with food  -Flexeril, a muscle relaxant at night  -Tylenol  500 mg OTC 2 tabs a day every 8 hours as needed for pain

## 2017-06-11 NOTE — Progress Notes (Signed)
Subjective:    Patient ID: Regina Ray, female    DOB: [birth year], 71 y.o.   MRN: [id number]  DOS:  06/11/2017 Type of visit - description : acute, Here with her daughter Interval history: The patient has chronic back pain however this time c/o a more persistent-intense pain for the last 3 weeks located at the left hip, let back with radiation to the left leg posteriorly. The pain at the back, is on and off on worse when she moves. The pain at the hip and left lower extremity is more steady.    Review of Systems   no recent injury but did have a severe back injury many years ago. No fever chills No urinary incontinence but severe urgency on-off, no bowel incontinence.   Past Medical History:  Diagnosis Date  . Allergy   . Anxiety and depression   . Bronchitis   . DJD (degenerative joint disease)   . Fatty liver    per [place] 2008  . Gastroparesis    per EGD 2008  . HTN (hypertension)   . Hyperlipidemia     Past Surgical History:  Procedure Laterality Date  . ABIs normal  05-2009   for question of claudication  . APPENDECTOMY    . 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 Marital status: Married    Spouse name: N/A  . Number of children: 4  . Years of education: N/A   Occupational History  . stay home    Social History Main Topics  . Smoking status: Never Smoker  . Smokeless tobacco: Never Used  . Alcohol use No  . Drug use: No  . Sexual activity: Not on file   Other Topics Concern  . Not on file   Social History Narrative   Household-- pt, husband, 1 daughter    From [place]   Does not drive      Allergies as of 06/11/2017      Reactions   Penicillins    REACTION: anaphylactic rxn -- sob -- swelling   Sertraline Hcl    REACTION: palpitations      Medication List       Accurate as of 06/11/17 11:59 PM. Always use your most recent med list.          amLODipine 5 MG  tablet Commonly known as:  NORVASC Take 5 mg by mouth daily.   CALCIUM & VIT D3 BONE HEALTH PO Take by mouth.   cyclobenzaprine 10 MG tablet Commonly known as:  FLEXERIL Take 1 tablet (10 mg total) by mouth at bedtime as needed for muscle spasms.   fish oil-omega-3 fatty acids 1000 MG capsule Take 2 g by mouth daily.   FLUoxetine 20 MG tablet Commonly known as:  PROZAC Take 1 tablet (20 mg total) by mouth daily.   ketorolac 10 MG tablet Commonly known as:  TORADOL Take 1 tablet (10 mg total) by mouth every 6 (six) hours as needed.   losartan 100 MG tablet Commonly known as:  COZAAR Take 1 tablet (100 mg total) by mouth daily.   naproxen 250 MG tablet Commonly known as:  NAPROSYN Take 500 mg by mouth daily as needed.   predniSONE 10 MG tablet Commonly known as:  DELTASONE 4 tablets x 2 days, 3 tabs x 2 days, 2 tabs x 2 days, 1 tab x 2 days   ranitidine 300 MG tablet Commonly  known as:  ZANTAC Take 300 mg by mouth daily.          Objective:   Physical Exam BP 126/74 (BP Location: Left Arm, Patient Position: Sitting, Cuff Size: Normal)   Pulse 67   Temp (!) 97.4 F (36.3 C) (Oral)   Resp 14   Ht 4\' 11"  (1.499 m)   Wt 178 lb (80.7 kg)   SpO2 97%   BMI 35.95 kg/m  General:   Well developed, well nourished . NAD.  HEENT:  Normocephalic . Face symmetric, atraumatic MSK: Slightly TTP on the left lower back. Hip rotation satisfactory bilaterally. Negative straight leg test. When we were doing the MSK exam, her main pain was  actually knee pain. Mild antalgic posture when she transferred from the chair to the table. Skin: Not pale. Not jaundice Neurologic:  alert & oriented X3.  Speech normal, gait appropriate for age and unassisted. DTRs symmetric Psych--  Cognition and judgment appear intact.  Cooperative with normal attention span and concentration.  Behavior appropriate. No anxious or depressed appearing.      Assessment & Plan:   Assessment   HTN Hyperlipidemia- Lipitor DC 10/2016 (aches ) Restrictive lung disease responsive to bronchodilators (last note available  from asthma-allergist 2014), used to be on Qvar Anxiety depression: had palpitations with sertraline, Lexapro (rx 10-2015 $$), rx prozac 11-2015 DJD Cardiac surgery ~ age 89 (? Intra-auricular communication ) Fatty liver 2008 ? Claudication: Normal ABIs 2010 DOE: saw cards >> 12/02/2015: Stress test low risk, echocardiogram reassuring (see report)  PLAN Acute on chronic back pain, hip pain with some radicular features. Neurological exam is normal. When she was seen a few months ago for a physical she was referred to orthopedic surgery but that never happened. Plan: : Stop naproxen, try Toradol. Round of prednisone. Refer to orthopedic. Check x-rays. Instructions discussed with the patient's daughter in [place] and [place] Dismissal: The patient had 8 no-shows in  a year. Letter of dismissal provided today.

## 2017-06-11 NOTE — Progress Notes (Signed)
Pre visit review using our clinic review tool, if applicable. No additional management support is needed unless otherwise documented below in the visit note. 

## 2017-06-11 NOTE — Telephone Encounter (Signed)
Pt seen today- dismissal letter given to Pt at time of todays appt. Can HIM be informed for 30 day start date?

## 2017-06-12 NOTE — Assessment & Plan Note (Signed)
Acute on chronic back pain, hip pain with some radicular features. Neurological exam is normal. When she was seen a few months ago for a physical she was referred to orthopedic surgery but that never happened. Plan: : Stop naproxen, try Toradol. Round of prednisone. Refer to orthopedic. Check x-rays. Instructions discussed with the patient's daughter in [place] and [place] Dismissal: The patient had 8 no-shows in  a year. Letter of dismissal provided today.

## 2017-06-14 ENCOUNTER — Telehealth: Payer: Self-pay

## 2017-06-14 NOTE — Telephone Encounter (Signed)
Spoke w/ Santiago Glad from [place], informed they did approve PA however only approved #20 tabs for 30 days instead of 5 days. Santiago Glad informed I would need to let [employer] know to change to 30 days. Spoke w/ [employer] informed of above. PA approved, and authorized.

## 2017-06-14 NOTE — Telephone Encounter (Signed)
PA initiated via Covermymeds; KEY: [id number]. Awaiting determination.

## 2017-06-15 NOTE — Telephone Encounter (Signed)
Received signed domestic return receipt verifying delivery of certified letter on June 10 2017 Article number [id number] [phone] daj

## 2017-06-26 ENCOUNTER — Other Ambulatory Visit: Payer: Self-pay | Admitting: Internal Medicine

## 2017-07-12 ENCOUNTER — Ambulatory Visit (AMBULATORY_SURGERY_CENTER): Payer: Self-pay | Admitting: *Deleted

## 2017-07-12 VITALS — Ht <= 58 in | Wt 177.0 lb

## 2017-07-12 DIAGNOSIS — Z1211 Encounter for screening for malignant neoplasm of colon: Secondary | ICD-10-CM

## 2017-07-12 MED ORDER — NA SULFATE-K SULFATE-MG SULF 17.5-3.13-1.6 GM/177ML PO SOLN: 1.0000 | Freq: Once | ORAL | 0 refills | Status: AC

## 2017-07-12 NOTE — Progress Notes (Signed)
No egg or soy allergy known to patient  No issues with past sedation with any surgeries  or procedures, no intubation problems  No diet pills per patient No home 02 use per patient  No blood thinners per patient  Pt denies issues with constipation  No A fib or A flutter  EMMI video sent to pt's e mail in spanish  Instructions given today in [place] and [place]  Daughter , Rosemarie Ax was the interpreter for pt and will be the day of her colon as well- pt signed release of responsibility for interpretation today in [place]  All questions answered today

## 2017-07-15 ENCOUNTER — Encounter: Payer: Self-pay | Admitting: Gastroenterology

## 2017-07-20 ENCOUNTER — Encounter: Payer: Self-pay | Admitting: Gastroenterology

## 2017-07-21 ENCOUNTER — Ambulatory Visit (INDEPENDENT_AMBULATORY_CARE_PROVIDER_SITE_OTHER): Payer: Medicare Other | Admitting: Orthopaedic Surgery

## 2017-07-29 ENCOUNTER — Ambulatory Visit (AMBULATORY_SURGERY_CENTER): Payer: Medicare HMO | Admitting: Gastroenterology

## 2017-07-29 ENCOUNTER — Encounter: Payer: Self-pay | Admitting: Gastroenterology

## 2017-07-29 VITALS — BP 127/52 | HR 68 | Temp 98.0°F | Resp 11 | Ht 58.8 in | Wt 177.0 lb

## 2017-07-29 DIAGNOSIS — K635 Polyp of colon: Secondary | ICD-10-CM | POA: Diagnosis not present

## 2017-07-29 DIAGNOSIS — K6389 Other specified diseases of intestine: Secondary | ICD-10-CM | POA: Diagnosis not present

## 2017-07-29 DIAGNOSIS — K219 Gastro-esophageal reflux disease without esophagitis: Secondary | ICD-10-CM | POA: Diagnosis not present

## 2017-07-29 DIAGNOSIS — Z1211 Encounter for screening for malignant neoplasm of colon: Secondary | ICD-10-CM | POA: Diagnosis not present

## 2017-07-29 DIAGNOSIS — D124 Benign neoplasm of descending colon: Secondary | ICD-10-CM

## 2017-07-29 DIAGNOSIS — D214 Benign neoplasm of connective and other soft tissue of abdomen: Secondary | ICD-10-CM | POA: Diagnosis not present

## 2017-07-29 DIAGNOSIS — I10 Essential (primary) hypertension: Secondary | ICD-10-CM | POA: Diagnosis not present

## 2017-07-29 DIAGNOSIS — D123 Benign neoplasm of transverse colon: Secondary | ICD-10-CM

## 2017-07-29 MED ORDER — SODIUM CHLORIDE 0.9 % IV SOLN: 500.0000 mL | INTRAVENOUS | Status: DC

## 2017-07-29 NOTE — Progress Notes (Signed)
Pt's states no medical or surgical changes since previsit or office visit. 

## 2017-07-29 NOTE — Progress Notes (Signed)
Report to PACU, RN, vss, BBS= Clear.  

## 2017-07-29 NOTE — Progress Notes (Signed)
Called to room to assist during endoscopic procedure.  Patient ID and intended procedure confirmed with present staff. Received instructions for my participation in the procedure from the performing physician.  

## 2017-07-29 NOTE — Patient Instructions (Signed)
**   Handouts given on polyps and diverticulosis **   YOU HAD AN ENDOSCOPIC PROCEDURE TODAY AT THE [facility]:   Refer to the procedure report that was given to you for any specific questions about what was found during the examination.  If the procedure report does not answer your questions, please call your gastroenterologist to clarify.  If you requested that your care partner not be given the details of your procedure findings, then the procedure report has been included in a sealed envelope for you to review at your convenience later.  YOU SHOULD EXPECT: Some feelings of bloating in the abdomen. Passage of more gas than usual.  Walking can help get rid of the air that was put into your GI tract during the procedure and reduce the bloating. If you had a lower endoscopy (such as a colonoscopy or flexible sigmoidoscopy) you may notice spotting of blood in your stool or on the toilet paper. If you underwent a bowel prep for your procedure, you may not have a normal bowel movement for a few days.  Please Note:  You might notice some irritation and congestion in your nose or some drainage.  This is from the oxygen used during your procedure.  There is no need for concern and it should clear up in a day or so.  SYMPTOMS TO REPORT IMMEDIATELY:   Following lower endoscopy (colonoscopy or flexible sigmoidoscopy):  Excessive amounts of blood in the stool  Significant tenderness or worsening of abdominal pains  Swelling of the abdomen that is new, acute  Fever of 100F or higher  For urgent or emergent issues, a gastroenterologist can be reached at any hour by calling [phone].   DIET:  We do recommend a small meal at first, but then you may proceed to your regular diet.  Drink plenty of fluids but you should avoid alcoholic beverages for 24 hours.  ACTIVITY:  You should plan to take it easy for the rest of today and you should NOT DRIVE or use heavy machinery until tomorrow (because  of the sedation medicines used during the test).    FOLLOW UP: Our staff will call the number listed on your records the next business day following your procedure to check on you and address any questions or concerns that you may have regarding the information given to you following your procedure. If we do not reach you, we will leave a message.  However, if you are feeling well and you are not experiencing any problems, there is no need to return our call.  We will assume that you have returned to your regular daily activities without incident.  If any biopsies were taken you will be contacted by phone or by letter within the next 1-3 weeks.  Please call us at [phone] if you have not heard about the biopsies in 3 weeks.    SIGNATURES/CONFIDENTIALITY: You and/or your care partner have signed paperwork which will be entered into your electronic medical record.  These signatures attest to the fact that that the information above on your After Visit Summary has been reviewed and is understood.  Full responsibility of the confidentiality of this discharge information lies with you and/or your care-partner. 

## 2017-07-29 NOTE — Op Note (Signed)
[facility] Patient Name: Regina Ray Procedure Date: 07/29/2017 8:28 AM MRN: [id number] Endoscopist: Mallie Mussel L. Loletha Carrow , MD Age: 71 Referring MD:  Date of Birth: 1945-11-10 Gender: Female Account #: [id number] Procedure:                Colonoscopy Indications:              Screening for colorectal malignant neoplasm (no                            polyps on 01/2007 colonoscopy) Medicines:                Monitored Anesthesia Care Procedure:                Pre-Anesthesia Assessment:                           - Prior to the procedure, a History and Physical                            was performed, and patient medications and                            allergies were reviewed. The patient's tolerance of                            previous anesthesia was also reviewed. The risks                            and benefits of the procedure and the sedation                            options and risks were discussed with the patient.                            All questions were answered, and informed consent                            was obtained. Prior Anticoagulants: The patient has                            taken no previous anticoagulant or antiplatelet                            agents. ASA Grade Assessment: II - A patient with                            mild systemic disease. After reviewing the risks                            and benefits, the patient was deemed in                            satisfactory condition to undergo the procedure.  After obtaining informed consent, the colonoscope                            was passed under direct vision. Throughout the                            procedure, the patient's blood pressure, pulse, and                            oxygen saturations were monitored continuously. The                            Colonoscope was introduced through the anus and                            advanced to the the cecum,  identified by                            appendiceal orifice and ileocecal valve. The                            colonoscopy was performed without difficulty. The                            patient tolerated the procedure well. The quality                            of the bowel preparation was good. The ileocecal                            valve, appendiceal orifice, and rectum were                            photographed. The quality of the bowel preparation                            was evaluated using the BBPS [facility] Bowel                            Preparation Scale) with scores of: Right Colon = 3,                            Transverse Colon = 2 and Left Colon = 2. The total                            BBPS score equals 7. The bowel preparation used was                            SUPREP. Scope In: 8:31:13 AM Scope Out: 8:44:24 AM Scope Withdrawal Time: 0 hours 10 minutes 13 seconds  Total Procedure Duration: 0 hours 13 minutes 11 seconds  Findings:                 The perianal and digital rectal  examinations were                            normal.                           Two sessile polyps were found in the mid descending                            colon and proximal transverse colon. The polyps                            were 2 mm in size. These polyps were removed with a                            cold biopsy forceps. Resection and retrieval were                            complete.                           Diverticula were found in the left colon.                           The exam was otherwise without abnormality on                            direct and retroflexion views. Complications:            No immediate complications. Estimated Blood Loss:     Estimated blood loss: none. Impression:               - Two 2 mm polyps in the mid descending colon and                            in the proximal transverse colon, removed with a                            cold biopsy  forceps. Resected and retrieved.                           - Diverticulosis in the left colon.                           - The examination was otherwise normal on direct                            and retroflexion views. Recommendation:           - Patient has a contact number available for                            emergencies. The signs and symptoms of potential                            delayed complications were discussed with the  patient. Return to normal activities tomorrow.                            Written discharge instructions were provided to the                            patient.                           - Resume previous diet.                           - Continue present medications.                           - Await pathology results.                           - Repeat colonoscopy is recommended for                            surveillance. The colonoscopy date will be                            determined after pathology results from today's                            exam become available for review. Henry L. Loletha Carrow, MD 07/29/2017 8:48:53 AM This report has been signed electronically.

## 2017-07-29 NOTE — Progress Notes (Signed)
CRNA made aware that pt. Ate chicken and grapes on 07/28/17 @ 1500.

## 2017-07-30 ENCOUNTER — Telehealth: Payer: Self-pay | Admitting: *Deleted

## 2017-07-30 NOTE — Telephone Encounter (Signed)
Message left

## 2017-08-02 ENCOUNTER — Telehealth: Payer: Self-pay | Admitting: *Deleted

## 2017-08-02 NOTE — Telephone Encounter (Signed)
  Follow up Call-  Call back number 07/29/2017  Post procedure Call Back phone  # [phone] NUMBER OKAY TO TALK WITH  Permission to leave phone message Yes  Some recent data might be hidden     Patient questions:  Do you have a fever, pain , or abdominal swelling? No. Pain Score  0 *  Have you tolerated food without any problems? Yes.    Have you been able to return to your normal activities? Yes.    Do you have any questions about your discharge instructions: Diet   No. Medications  No. Follow up visit  No.  Do you have questions or concerns about your Care? No.  Actions: * If pain score is 4 or above: No action needed, pain <4.

## 2017-08-06 ENCOUNTER — Encounter: Payer: Self-pay | Admitting: Gastroenterology

## 2017-11-18 DIAGNOSIS — Z760 Encounter for issue of repeat prescription: Secondary | ICD-10-CM | POA: Diagnosis not present

## 2017-12-03 ENCOUNTER — Telehealth: Payer: Self-pay

## 2017-12-03 ENCOUNTER — Ambulatory Visit: Payer: Self-pay | Admitting: Family Medicine

## 2017-12-03 NOTE — Telephone Encounter (Signed)
Pt has been dismissed from [facility].

## 2017-12-03 NOTE — Telephone Encounter (Signed)
Copied from [place]. Topic: Appointment Scheduling - Scheduling Inquiry for Clinic >> Dec 03, 2017  9:33 AM Burnis Medin, NT wrote: Reason for CRM: Patient called and wanted to see if she can switch from the [facility] office to [place] to establish care because it is closer to her home. If possible patient would like call back for scheduling.

## 2017-12-03 NOTE — Telephone Encounter (Signed)
Patient was scheduled in error to be seen at [facility] with Dr. Ethelene Hal at 3:00 PM today (12/03/17). Per telephone encounter 06/04/17, the patient was dismissed from [facility] on 06/02/17 by Dr. Larose Kells; dismissal letter sent out the same day by certified mail & signed domestic return receipt was received on 06/10/17. [facility] Leadership were made aware of the error via e-mail; replied response stated that "the agent did not have any luck reaching the patient, but a detailed message was left utilizing an interpreter to inform patient that her appointment has been cancelled, but call back to reschedule at another practice".  At 2:53 PM on 12/03/17, patient & interpreter presented in the office for 3:00 PM appointment, which had already been cancelled by the [facility]. RN Supervisor brought both parties back to an exam room to discuss the matter. RN informed the patient that the [place] attempted to contact her to cancel the appointment regarding previous dismissal from [facility] effective 06/02/17. The interpreter voiced that the patient didn't think that it was for all [place] offices, only [place]. RN discussed that patient can still be seen by the health sytem, but with the exception of all [place] practices. This information was communicated to the patient; she verbalized understanding. RN apologized for the error. Prior to exiting the facility, the patient did not have any further questions or concerns.

## 2017-12-29 DIAGNOSIS — E7800 Pure hypercholesterolemia, unspecified: Secondary | ICD-10-CM | POA: Diagnosis not present

## 2017-12-29 DIAGNOSIS — R5383 Other fatigue: Secondary | ICD-10-CM | POA: Diagnosis not present

## 2017-12-29 DIAGNOSIS — Z0000 Encounter for general adult medical examination without abnormal findings: Secondary | ICD-10-CM | POA: Diagnosis not present

## 2017-12-29 DIAGNOSIS — I10 Essential (primary) hypertension: Secondary | ICD-10-CM | POA: Diagnosis not present

## 2018-01-11 DIAGNOSIS — R300 Dysuria: Secondary | ICD-10-CM | POA: Diagnosis not present

## 2018-01-11 DIAGNOSIS — M25552 Pain in left hip: Secondary | ICD-10-CM | POA: Diagnosis not present

## 2018-01-11 DIAGNOSIS — R82998 Other abnormal findings in urine: Secondary | ICD-10-CM | POA: Diagnosis not present

## 2018-01-11 DIAGNOSIS — N393 Stress incontinence (female) (male): Secondary | ICD-10-CM | POA: Diagnosis not present

## 2018-01-17 DIAGNOSIS — L821 Other seborrheic keratosis: Secondary | ICD-10-CM | POA: Diagnosis not present

## 2018-01-17 DIAGNOSIS — L720 Epidermal cyst: Secondary | ICD-10-CM | POA: Diagnosis not present

## 2018-01-17 DIAGNOSIS — L820 Inflamed seborrheic keratosis: Secondary | ICD-10-CM | POA: Diagnosis not present

## 2018-02-09 DIAGNOSIS — A499 Bacterial infection, unspecified: Secondary | ICD-10-CM | POA: Diagnosis not present

## 2018-02-09 DIAGNOSIS — E782 Mixed hyperlipidemia: Secondary | ICD-10-CM | POA: Diagnosis not present

## 2018-02-09 DIAGNOSIS — I10 Essential (primary) hypertension: Secondary | ICD-10-CM | POA: Diagnosis not present

## 2018-02-09 DIAGNOSIS — N390 Urinary tract infection, site not specified: Secondary | ICD-10-CM | POA: Diagnosis not present

## 2018-03-14 DIAGNOSIS — D1801 Hemangioma of skin and subcutaneous tissue: Secondary | ICD-10-CM | POA: Diagnosis not present

## 2018-03-14 DIAGNOSIS — L820 Inflamed seborrheic keratosis: Secondary | ICD-10-CM | POA: Diagnosis not present

## 2018-03-14 DIAGNOSIS — L821 Other seborrheic keratosis: Secondary | ICD-10-CM | POA: Diagnosis not present

## 2018-03-25 DIAGNOSIS — I10 Essential (primary) hypertension: Secondary | ICD-10-CM | POA: Diagnosis not present

## 2018-03-25 DIAGNOSIS — E7800 Pure hypercholesterolemia, unspecified: Secondary | ICD-10-CM | POA: Diagnosis not present

## 2018-03-25 DIAGNOSIS — M1990 Unspecified osteoarthritis, unspecified site: Secondary | ICD-10-CM | POA: Diagnosis not present

## 2018-03-31 DIAGNOSIS — H5213 Myopia, bilateral: Secondary | ICD-10-CM | POA: Diagnosis not present

## 2018-04-13 DIAGNOSIS — R0789 Other chest pain: Secondary | ICD-10-CM | POA: Diagnosis not present

## 2018-04-13 DIAGNOSIS — E7801 Familial hypercholesterolemia: Secondary | ICD-10-CM | POA: Diagnosis not present

## 2018-04-13 DIAGNOSIS — R0602 Shortness of breath: Secondary | ICD-10-CM | POA: Diagnosis not present

## 2018-04-13 DIAGNOSIS — I10 Essential (primary) hypertension: Secondary | ICD-10-CM | POA: Diagnosis not present

## 2018-04-13 DIAGNOSIS — M7918 Myalgia, other site: Secondary | ICD-10-CM | POA: Diagnosis not present

## 2018-04-21 DIAGNOSIS — I10 Essential (primary) hypertension: Secondary | ICD-10-CM | POA: Diagnosis not present

## 2018-06-21 DIAGNOSIS — L720 Epidermal cyst: Secondary | ICD-10-CM | POA: Diagnosis not present

## 2018-06-21 DIAGNOSIS — L821 Other seborrheic keratosis: Secondary | ICD-10-CM | POA: Diagnosis not present

## 2018-06-21 DIAGNOSIS — L738 Other specified follicular disorders: Secondary | ICD-10-CM | POA: Diagnosis not present

## 2018-07-16 DIAGNOSIS — Z79899 Other long term (current) drug therapy: Secondary | ICD-10-CM | POA: Diagnosis not present

## 2018-07-16 DIAGNOSIS — Z23 Encounter for immunization: Secondary | ICD-10-CM | POA: Diagnosis not present

## 2018-07-16 DIAGNOSIS — I10 Essential (primary) hypertension: Secondary | ICD-10-CM | POA: Diagnosis not present

## 2018-07-16 DIAGNOSIS — Z131 Encounter for screening for diabetes mellitus: Secondary | ICD-10-CM | POA: Diagnosis not present

## 2018-07-16 DIAGNOSIS — R5383 Other fatigue: Secondary | ICD-10-CM | POA: Diagnosis not present

## 2018-07-16 DIAGNOSIS — E7800 Pure hypercholesterolemia, unspecified: Secondary | ICD-10-CM | POA: Diagnosis not present

## 2018-07-18 ENCOUNTER — Other Ambulatory Visit: Payer: Self-pay | Admitting: Internal Medicine

## 2018-07-18 DIAGNOSIS — Z1231 Encounter for screening mammogram for malignant neoplasm of breast: Secondary | ICD-10-CM

## 2019-04-03 DIAGNOSIS — R7301 Impaired fasting glucose: Secondary | ICD-10-CM | POA: Diagnosis not present

## 2019-04-03 DIAGNOSIS — G8929 Other chronic pain: Secondary | ICD-10-CM | POA: Diagnosis not present

## 2019-04-03 DIAGNOSIS — M545 Low back pain: Secondary | ICD-10-CM | POA: Diagnosis not present

## 2019-04-03 DIAGNOSIS — R350 Frequency of micturition: Secondary | ICD-10-CM | POA: Diagnosis not present

## 2019-04-07 DIAGNOSIS — R3129 Other microscopic hematuria: Secondary | ICD-10-CM | POA: Diagnosis not present

## 2019-04-17 DIAGNOSIS — R42 Dizziness and giddiness: Secondary | ICD-10-CM | POA: Diagnosis not present

## 2019-04-17 DIAGNOSIS — M7989 Other specified soft tissue disorders: Secondary | ICD-10-CM | POA: Diagnosis not present

## 2019-04-17 DIAGNOSIS — R001 Bradycardia, unspecified: Secondary | ICD-10-CM | POA: Diagnosis not present

## 2019-04-17 DIAGNOSIS — R9431 Abnormal electrocardiogram [ECG] [EKG]: Secondary | ICD-10-CM | POA: Diagnosis not present

## 2019-04-19 DIAGNOSIS — R9431 Abnormal electrocardiogram [ECG] [EKG]: Secondary | ICD-10-CM | POA: Diagnosis not present

## 2019-04-26 DIAGNOSIS — R9431 Abnormal electrocardiogram [ECG] [EKG]: Secondary | ICD-10-CM | POA: Diagnosis not present

## 2019-04-26 DIAGNOSIS — R42 Dizziness and giddiness: Secondary | ICD-10-CM | POA: Diagnosis not present

## 2019-05-08 DIAGNOSIS — Z0000 Encounter for general adult medical examination without abnormal findings: Secondary | ICD-10-CM | POA: Diagnosis not present

## 2019-05-08 DIAGNOSIS — Z1159 Encounter for screening for other viral diseases: Secondary | ICD-10-CM | POA: Diagnosis not present

## 2019-05-08 DIAGNOSIS — L679 Hair color and hair shaft abnormality, unspecified: Secondary | ICD-10-CM | POA: Diagnosis not present

## 2019-05-08 DIAGNOSIS — E7800 Pure hypercholesterolemia, unspecified: Secondary | ICD-10-CM | POA: Diagnosis not present

## 2019-05-23 ENCOUNTER — Other Ambulatory Visit: Payer: Self-pay | Admitting: Adult Medicine

## 2019-05-23 DIAGNOSIS — Z1231 Encounter for screening mammogram for malignant neoplasm of breast: Secondary | ICD-10-CM

## 2019-07-06 ENCOUNTER — Other Ambulatory Visit: Payer: Self-pay

## 2019-07-06 ENCOUNTER — Ambulatory Visit
Admission: RE | Admit: 2019-07-06 | Discharge: 2019-07-06 | Disposition: A | Discharge status: Home / Self Care | Payer: Medicare HMO | Source: Ambulatory Visit | Attending: Adult Medicine | Admitting: Adult Medicine

## 2019-07-06 DIAGNOSIS — Z1231 Encounter for screening mammogram for malignant neoplasm of breast: Secondary | ICD-10-CM

## 2019-07-26 DIAGNOSIS — R7301 Impaired fasting glucose: Secondary | ICD-10-CM | POA: Diagnosis not present

## 2019-07-26 DIAGNOSIS — Z23 Encounter for immunization: Secondary | ICD-10-CM | POA: Diagnosis not present

## 2019-07-26 DIAGNOSIS — Z1159 Encounter for screening for other viral diseases: Secondary | ICD-10-CM | POA: Diagnosis not present

## 2019-07-26 DIAGNOSIS — I8390 Asymptomatic varicose veins of unspecified lower extremity: Secondary | ICD-10-CM | POA: Diagnosis not present

## 2019-07-26 DIAGNOSIS — I10 Essential (primary) hypertension: Secondary | ICD-10-CM | POA: Diagnosis not present

## 2019-07-26 DIAGNOSIS — E7800 Pure hypercholesterolemia, unspecified: Secondary | ICD-10-CM | POA: Diagnosis not present

## 2019-08-07 DIAGNOSIS — R7301 Impaired fasting glucose: Secondary | ICD-10-CM | POA: Diagnosis not present

## 2019-08-07 DIAGNOSIS — I8390 Asymptomatic varicose veins of unspecified lower extremity: Secondary | ICD-10-CM | POA: Diagnosis not present

## 2019-08-07 DIAGNOSIS — E7800 Pure hypercholesterolemia, unspecified: Secondary | ICD-10-CM | POA: Diagnosis not present

## 2019-08-07 DIAGNOSIS — I10 Essential (primary) hypertension: Secondary | ICD-10-CM | POA: Diagnosis not present

## 2019-08-15 DIAGNOSIS — I83813 Varicose veins of bilateral lower extremities with pain: Secondary | ICD-10-CM | POA: Diagnosis not present

## 2019-08-15 DIAGNOSIS — I8393 Asymptomatic varicose veins of bilateral lower extremities: Secondary | ICD-10-CM | POA: Diagnosis not present

## 2019-10-25 DIAGNOSIS — E7800 Pure hypercholesterolemia, unspecified: Secondary | ICD-10-CM | POA: Diagnosis not present

## 2019-10-25 DIAGNOSIS — I10 Essential (primary) hypertension: Secondary | ICD-10-CM | POA: Diagnosis not present

## 2019-10-25 DIAGNOSIS — R7301 Impaired fasting glucose: Secondary | ICD-10-CM | POA: Diagnosis not present

## 2019-10-25 DIAGNOSIS — R42 Dizziness and giddiness: Secondary | ICD-10-CM | POA: Diagnosis not present

## 2019-11-14 DIAGNOSIS — R7301 Impaired fasting glucose: Secondary | ICD-10-CM | POA: Diagnosis not present

## 2019-11-14 DIAGNOSIS — I10 Essential (primary) hypertension: Secondary | ICD-10-CM | POA: Diagnosis not present

## 2019-11-14 DIAGNOSIS — R42 Dizziness and giddiness: Secondary | ICD-10-CM | POA: Diagnosis not present

## 2019-11-14 DIAGNOSIS — E7800 Pure hypercholesterolemia, unspecified: Secondary | ICD-10-CM | POA: Diagnosis not present

## 2019-11-29 DIAGNOSIS — R9431 Abnormal electrocardiogram [ECG] [EKG]: Secondary | ICD-10-CM | POA: Diagnosis not present

## 2019-11-29 DIAGNOSIS — R42 Dizziness and giddiness: Secondary | ICD-10-CM | POA: Diagnosis not present

## 2019-12-15 DIAGNOSIS — R55 Syncope and collapse: Secondary | ICD-10-CM | POA: Diagnosis not present

## 2019-12-15 DIAGNOSIS — M79604 Pain in right leg: Secondary | ICD-10-CM | POA: Diagnosis not present

## 2019-12-15 DIAGNOSIS — R9431 Abnormal electrocardiogram [ECG] [EKG]: Secondary | ICD-10-CM | POA: Diagnosis not present

## 2019-12-15 DIAGNOSIS — R0989 Other specified symptoms and signs involving the circulatory and respiratory systems: Secondary | ICD-10-CM | POA: Diagnosis not present

## 2019-12-15 DIAGNOSIS — M79605 Pain in left leg: Secondary | ICD-10-CM | POA: Diagnosis not present

## 2019-12-15 DIAGNOSIS — R42 Dizziness and giddiness: Secondary | ICD-10-CM | POA: Diagnosis not present

## 2020-01-19 DIAGNOSIS — R9431 Abnormal electrocardiogram [ECG] [EKG]: Secondary | ICD-10-CM | POA: Diagnosis not present

## 2020-01-19 DIAGNOSIS — M79605 Pain in left leg: Secondary | ICD-10-CM | POA: Diagnosis not present

## 2020-01-19 DIAGNOSIS — R0989 Other specified symptoms and signs involving the circulatory and respiratory systems: Secondary | ICD-10-CM | POA: Diagnosis not present

## 2020-01-19 DIAGNOSIS — M79604 Pain in right leg: Secondary | ICD-10-CM | POA: Diagnosis not present

## 2020-01-31 DIAGNOSIS — Z789 Other specified health status: Secondary | ICD-10-CM | POA: Diagnosis not present

## 2020-01-31 DIAGNOSIS — N183 Chronic kidney disease, stage 3 unspecified: Secondary | ICD-10-CM | POA: Diagnosis not present

## 2020-01-31 DIAGNOSIS — I10 Essential (primary) hypertension: Secondary | ICD-10-CM | POA: Diagnosis not present

## 2020-01-31 DIAGNOSIS — M549 Dorsalgia, unspecified: Secondary | ICD-10-CM | POA: Diagnosis not present

## 2020-01-31 DIAGNOSIS — R7303 Prediabetes: Secondary | ICD-10-CM | POA: Diagnosis not present

## 2020-01-31 DIAGNOSIS — E7800 Pure hypercholesterolemia, unspecified: Secondary | ICD-10-CM | POA: Diagnosis not present

## 2020-01-31 DIAGNOSIS — Z1339 Encounter for screening examination for other mental health and behavioral disorders: Secondary | ICD-10-CM | POA: Diagnosis not present

## 2020-03-07 DIAGNOSIS — M545 Low back pain: Secondary | ICD-10-CM | POA: Diagnosis not present

## 2020-03-07 DIAGNOSIS — R7303 Prediabetes: Secondary | ICD-10-CM | POA: Diagnosis not present

## 2020-03-07 DIAGNOSIS — E7800 Pure hypercholesterolemia, unspecified: Secondary | ICD-10-CM | POA: Diagnosis not present

## 2020-03-07 DIAGNOSIS — I10 Essential (primary) hypertension: Secondary | ICD-10-CM | POA: Diagnosis not present

## 2020-03-22 DIAGNOSIS — R42 Dizziness and giddiness: Secondary | ICD-10-CM | POA: Diagnosis not present

## 2020-03-22 DIAGNOSIS — J40 Bronchitis, not specified as acute or chronic: Secondary | ICD-10-CM | POA: Diagnosis not present

## 2020-03-22 DIAGNOSIS — R05 Cough: Secondary | ICD-10-CM | POA: Diagnosis not present

## 2020-03-22 DIAGNOSIS — M545 Low back pain: Secondary | ICD-10-CM | POA: Diagnosis not present

## 2020-07-02 DIAGNOSIS — Z008 Encounter for other general examination: Secondary | ICD-10-CM | POA: Diagnosis not present

## 2020-07-02 DIAGNOSIS — M5440 Lumbago with sciatica, unspecified side: Secondary | ICD-10-CM | POA: Diagnosis not present

## 2020-07-02 DIAGNOSIS — E669 Obesity, unspecified: Secondary | ICD-10-CM | POA: Diagnosis not present

## 2020-07-02 DIAGNOSIS — M810 Age-related osteoporosis without current pathological fracture: Secondary | ICD-10-CM | POA: Diagnosis not present

## 2020-07-02 DIAGNOSIS — Z791 Long term (current) use of non-steroidal anti-inflammatories (NSAID): Secondary | ICD-10-CM | POA: Diagnosis not present

## 2020-07-02 DIAGNOSIS — I739 Peripheral vascular disease, unspecified: Secondary | ICD-10-CM | POA: Diagnosis not present

## 2020-07-02 DIAGNOSIS — M1990 Unspecified osteoarthritis, unspecified site: Secondary | ICD-10-CM | POA: Diagnosis not present

## 2020-07-02 DIAGNOSIS — E785 Hyperlipidemia, unspecified: Secondary | ICD-10-CM | POA: Diagnosis not present

## 2020-07-02 DIAGNOSIS — I10 Essential (primary) hypertension: Secondary | ICD-10-CM | POA: Diagnosis not present

## 2020-07-02 DIAGNOSIS — Z6833 Body mass index (BMI) 33.0-33.9, adult: Secondary | ICD-10-CM | POA: Diagnosis not present

## 2020-07-02 DIAGNOSIS — G8929 Other chronic pain: Secondary | ICD-10-CM | POA: Diagnosis not present

## 2020-07-05 DIAGNOSIS — I10 Essential (primary) hypertension: Secondary | ICD-10-CM | POA: Diagnosis not present

## 2020-07-05 DIAGNOSIS — Z87448 Personal history of other diseases of urinary system: Secondary | ICD-10-CM | POA: Diagnosis not present

## 2020-07-05 DIAGNOSIS — R7303 Prediabetes: Secondary | ICD-10-CM | POA: Diagnosis not present

## 2020-07-05 DIAGNOSIS — E7800 Pure hypercholesterolemia, unspecified: Secondary | ICD-10-CM | POA: Diagnosis not present

## 2020-10-09 DIAGNOSIS — Z23 Encounter for immunization: Secondary | ICD-10-CM | POA: Diagnosis not present

## 2020-10-09 DIAGNOSIS — N183 Chronic kidney disease, stage 3 unspecified: Secondary | ICD-10-CM | POA: Diagnosis not present

## 2020-10-09 DIAGNOSIS — E785 Hyperlipidemia, unspecified: Secondary | ICD-10-CM | POA: Diagnosis not present

## 2020-10-09 DIAGNOSIS — R7303 Prediabetes: Secondary | ICD-10-CM | POA: Diagnosis not present

## 2020-10-09 DIAGNOSIS — I10 Essential (primary) hypertension: Secondary | ICD-10-CM | POA: Diagnosis not present

## 2020-10-09 DIAGNOSIS — M542 Cervicalgia: Secondary | ICD-10-CM | POA: Diagnosis not present

## 2020-10-10 ENCOUNTER — Other Ambulatory Visit: Payer: Self-pay | Admitting: Adult Medicine

## 2020-10-10 DIAGNOSIS — Z1231 Encounter for screening mammogram for malignant neoplasm of breast: Secondary | ICD-10-CM

## 2020-10-14 ENCOUNTER — Ambulatory Visit: Payer: Medicare HMO

## 2020-10-15 ENCOUNTER — Other Ambulatory Visit: Payer: Self-pay

## 2020-10-15 ENCOUNTER — Ambulatory Visit
Admission: RE | Admit: 2020-10-15 | Discharge: 2020-10-15 | Disposition: A | Discharge status: Home / Self Care | Payer: Medicare HMO | Source: Ambulatory Visit | Attending: Adult Medicine | Admitting: Adult Medicine

## 2020-10-15 DIAGNOSIS — Z1231 Encounter for screening mammogram for malignant neoplasm of breast: Secondary | ICD-10-CM | POA: Diagnosis not present

## 2020-12-10 DIAGNOSIS — M19011 Primary osteoarthritis, right shoulder: Secondary | ICD-10-CM | POA: Diagnosis not present

## 2020-12-10 DIAGNOSIS — M5459 Other low back pain: Secondary | ICD-10-CM | POA: Diagnosis not present

## 2021-02-07 DIAGNOSIS — M419 Scoliosis, unspecified: Secondary | ICD-10-CM | POA: Diagnosis not present

## 2021-02-07 DIAGNOSIS — M5416 Radiculopathy, lumbar region: Secondary | ICD-10-CM | POA: Diagnosis not present

## 2021-02-25 DIAGNOSIS — M5416 Radiculopathy, lumbar region: Secondary | ICD-10-CM | POA: Diagnosis not present

## 2021-03-12 DIAGNOSIS — Z1231 Encounter for screening mammogram for malignant neoplasm of breast: Secondary | ICD-10-CM | POA: Diagnosis not present

## 2021-06-28 IMAGING — MG MM DIGITAL SCREENING BILAT W/ TOMO W/ CAD
8 series · 8 of 24 positions shown · non-contrast
Comparison: Previous exam(s).

CLINICAL DATA: Screening.

EXAM:
DIGITAL SCREENING BILATERAL MAMMOGRAM WITH TOMO AND CAD

[R MLO synth-2D]
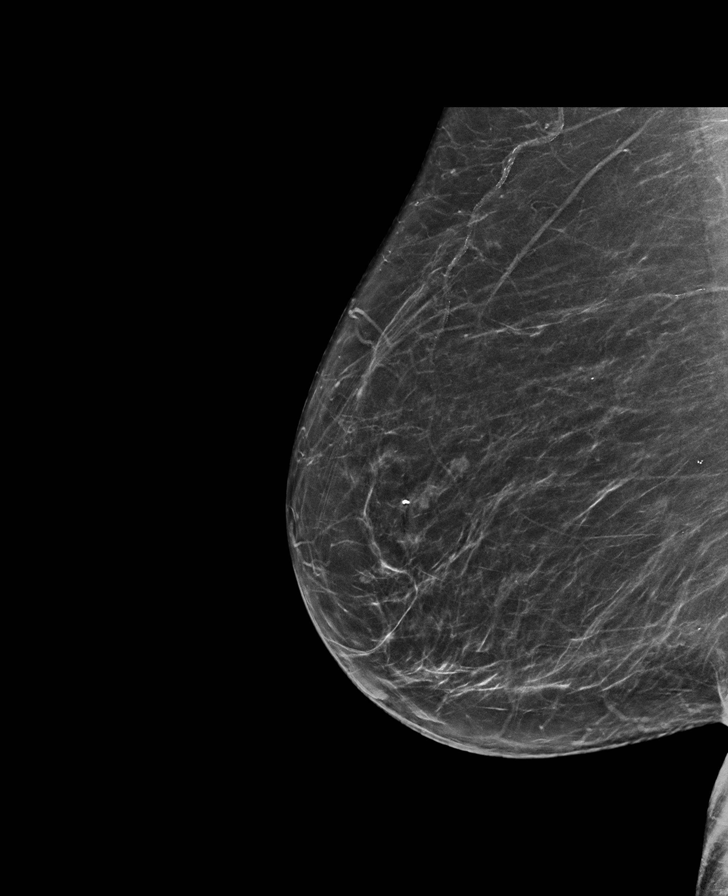

[L MLO synth-2D]
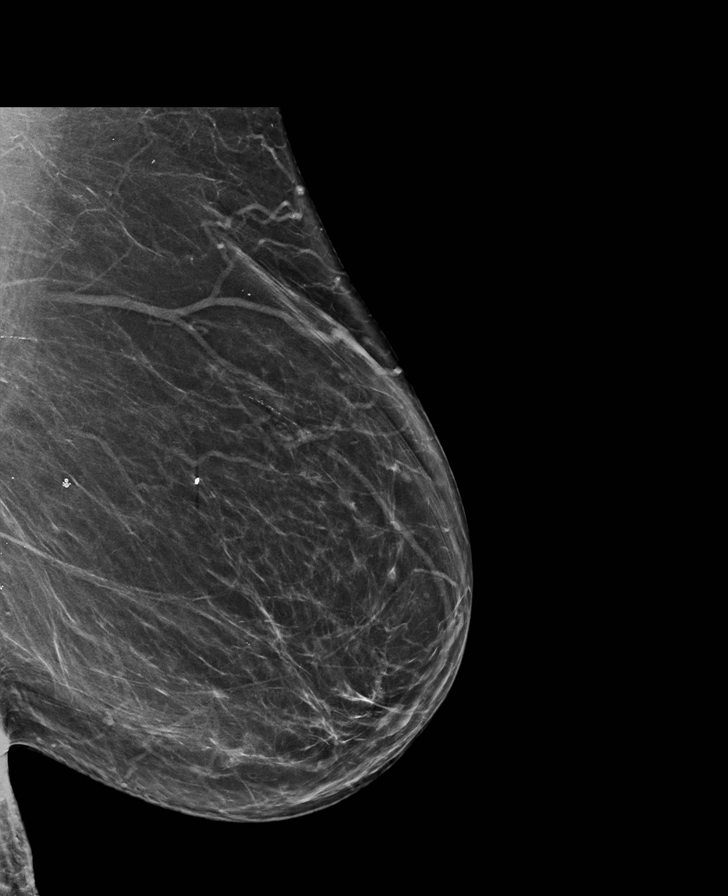

[R CC synth-2D]
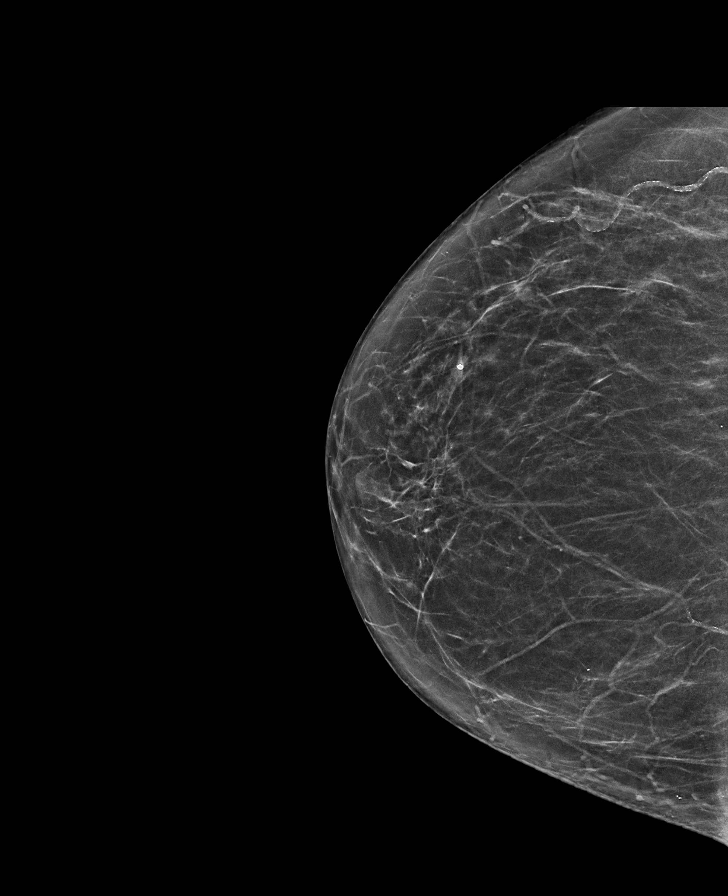

[L CC synth-2D]
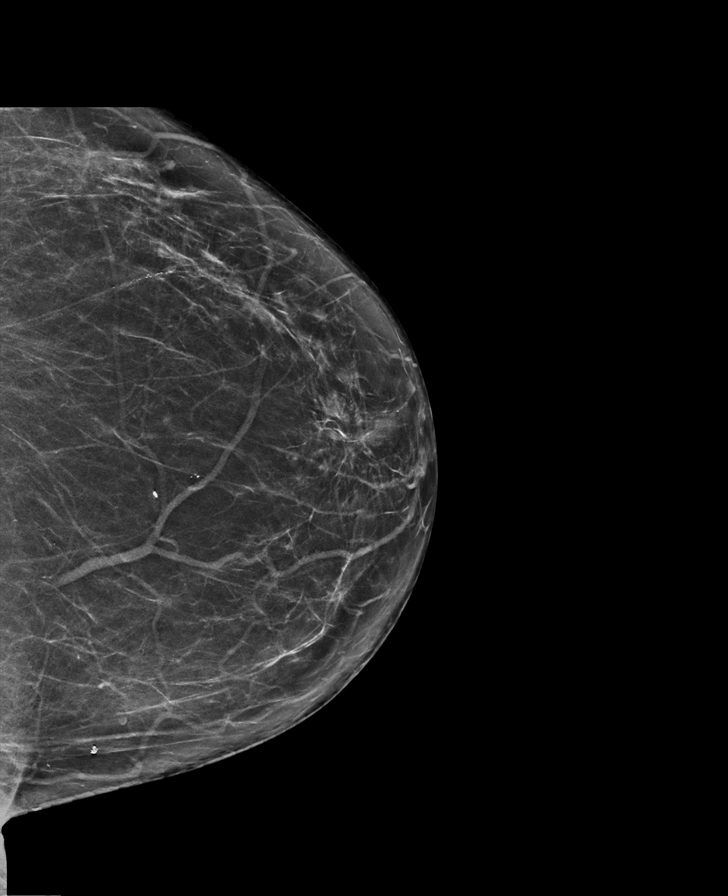

[R CC tomo · tomo slice 32/63.0]
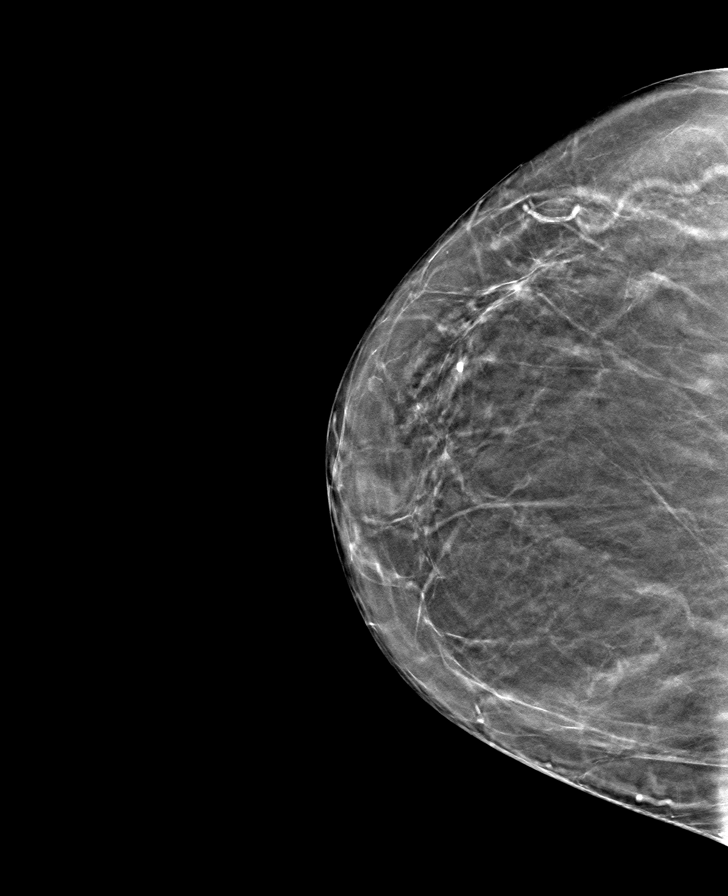

[L MLO tomo · tomo slice 39/77.0]
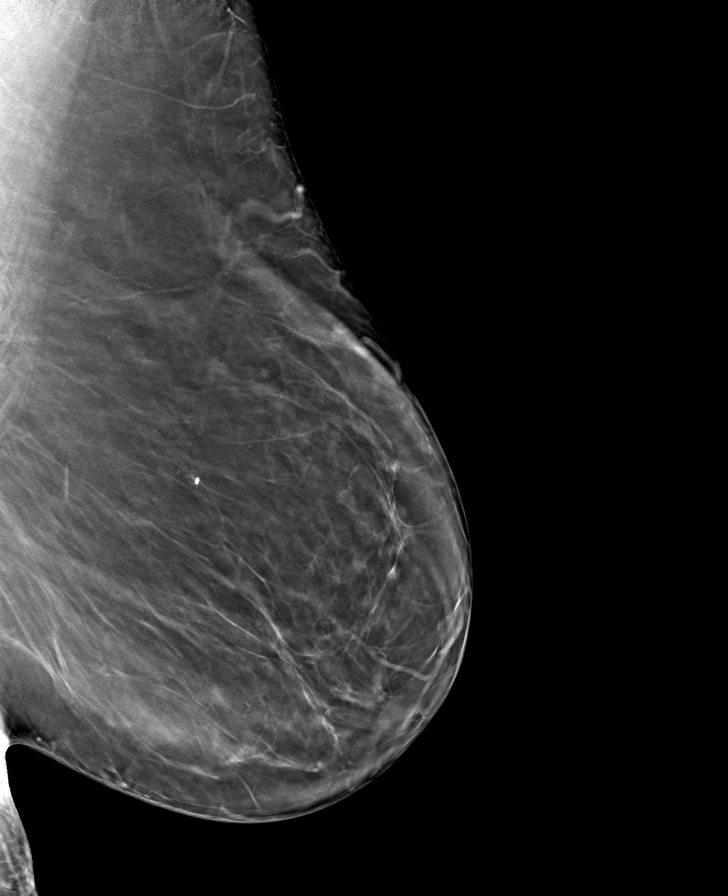

[L CC tomo · tomo slice 35/68.0]
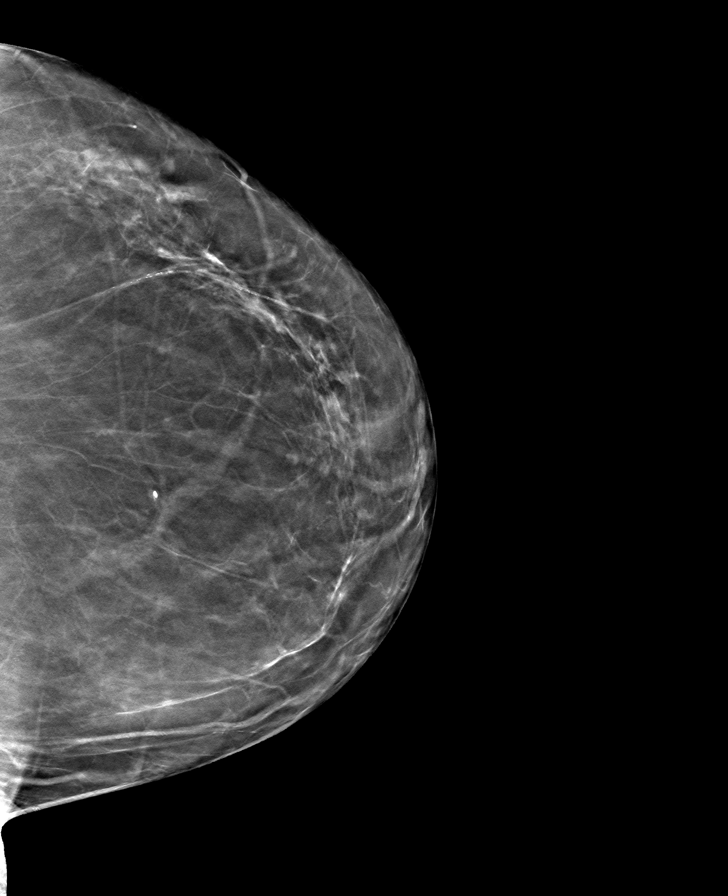

[R MLO tomo · tomo slice 36/71.0]
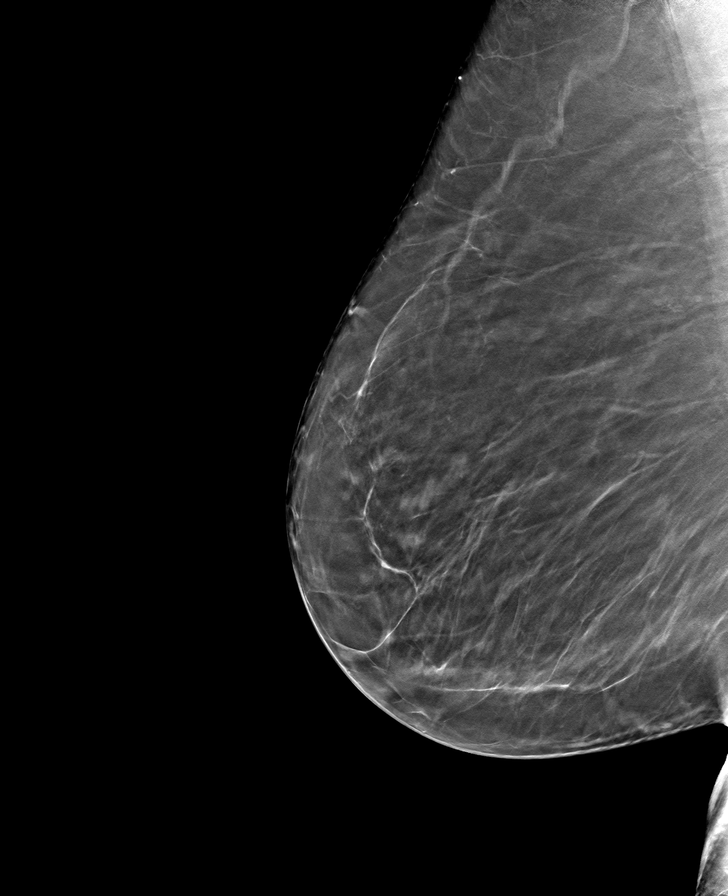

[8 of 24 positions shown; findings below may reference images not displayed]

ACR Breast Density Category b: There are scattered areas of
fibroglandular density.
FINDINGS: There are no findings suspicious for malignancy. Images were
processed with CAD.
IMPRESSION: No mammographic evidence of malignancy. A result letter of this
screening mammogram will be mailed directly to the patient.

RECOMMENDATION:
Screening mammogram in one year. (Code:[id number])

BI-RADS CATEGORY  1: Negative.

## 2021-07-17 DIAGNOSIS — E7800 Pure hypercholesterolemia, unspecified: Secondary | ICD-10-CM | POA: Diagnosis not present

## 2021-07-17 DIAGNOSIS — R5383 Other fatigue: Secondary | ICD-10-CM | POA: Diagnosis not present

## 2021-07-17 DIAGNOSIS — R7303 Prediabetes: Secondary | ICD-10-CM | POA: Diagnosis not present

## 2021-07-17 DIAGNOSIS — N183 Chronic kidney disease, stage 3 unspecified: Secondary | ICD-10-CM | POA: Diagnosis not present

## 2021-07-30 DIAGNOSIS — Z6834 Body mass index (BMI) 34.0-34.9, adult: Secondary | ICD-10-CM | POA: Diagnosis not present

## 2021-07-30 DIAGNOSIS — M5416 Radiculopathy, lumbar region: Secondary | ICD-10-CM | POA: Diagnosis not present

## 2021-07-30 DIAGNOSIS — I10 Essential (primary) hypertension: Secondary | ICD-10-CM | POA: Diagnosis not present

## 2021-08-12 DIAGNOSIS — R7303 Prediabetes: Secondary | ICD-10-CM | POA: Diagnosis not present

## 2021-08-12 DIAGNOSIS — I10 Essential (primary) hypertension: Secondary | ICD-10-CM | POA: Diagnosis not present

## 2021-08-12 DIAGNOSIS — E7800 Pure hypercholesterolemia, unspecified: Secondary | ICD-10-CM | POA: Diagnosis not present

## 2021-08-12 DIAGNOSIS — L659 Nonscarring hair loss, unspecified: Secondary | ICD-10-CM | POA: Diagnosis not present

## 2021-08-12 DIAGNOSIS — N183 Chronic kidney disease, stage 3 unspecified: Secondary | ICD-10-CM | POA: Diagnosis not present

## 2021-08-12 DIAGNOSIS — Z1159 Encounter for screening for other viral diseases: Secondary | ICD-10-CM | POA: Diagnosis not present

## 2021-09-18 DIAGNOSIS — M8589 Other specified disorders of bone density and structure, multiple sites: Secondary | ICD-10-CM | POA: Diagnosis not present

## 2021-09-18 DIAGNOSIS — F411 Generalized anxiety disorder: Secondary | ICD-10-CM | POA: Diagnosis not present

## 2021-09-18 DIAGNOSIS — Z639 Problem related to primary support group, unspecified: Secondary | ICD-10-CM | POA: Diagnosis not present

## 2021-09-18 DIAGNOSIS — R69 Illness, unspecified: Secondary | ICD-10-CM | POA: Diagnosis not present

## 2021-09-18 DIAGNOSIS — Z008 Encounter for other general examination: Secondary | ICD-10-CM | POA: Diagnosis not present

## 2022-01-22 DIAGNOSIS — I10 Essential (primary) hypertension: Secondary | ICD-10-CM | POA: Diagnosis not present

## 2022-01-22 DIAGNOSIS — E7800 Pure hypercholesterolemia, unspecified: Secondary | ICD-10-CM | POA: Diagnosis not present

## 2022-01-22 DIAGNOSIS — N183 Chronic kidney disease, stage 3 unspecified: Secondary | ICD-10-CM | POA: Diagnosis not present

## 2022-01-22 DIAGNOSIS — L659 Nonscarring hair loss, unspecified: Secondary | ICD-10-CM | POA: Diagnosis not present

## 2022-01-22 DIAGNOSIS — R7303 Prediabetes: Secondary | ICD-10-CM | POA: Diagnosis not present

## 2022-02-05 DIAGNOSIS — I10 Essential (primary) hypertension: Secondary | ICD-10-CM | POA: Diagnosis not present

## 2022-02-05 DIAGNOSIS — N183 Chronic kidney disease, stage 3 unspecified: Secondary | ICD-10-CM | POA: Diagnosis not present

## 2022-02-05 DIAGNOSIS — E7800 Pure hypercholesterolemia, unspecified: Secondary | ICD-10-CM | POA: Diagnosis not present

## 2022-02-05 DIAGNOSIS — R7303 Prediabetes: Secondary | ICD-10-CM | POA: Diagnosis not present

## 2022-03-20 DIAGNOSIS — J209 Acute bronchitis, unspecified: Secondary | ICD-10-CM | POA: Diagnosis not present

## 2022-05-02 DEATH — deceased

## 2022-05-04 DIAGNOSIS — R7303 Prediabetes: Secondary | ICD-10-CM | POA: Diagnosis not present

## 2022-05-04 DIAGNOSIS — Z0130 Encounter for examination of blood pressure without abnormal findings: Secondary | ICD-10-CM | POA: Diagnosis not present

## 2022-05-04 DIAGNOSIS — I890 Lymphedema, not elsewhere classified: Secondary | ICD-10-CM | POA: Diagnosis not present

## 2022-05-04 DIAGNOSIS — Z0000 Encounter for general adult medical examination without abnormal findings: Secondary | ICD-10-CM | POA: Diagnosis not present

## 2022-05-04 DIAGNOSIS — Z789 Other specified health status: Secondary | ICD-10-CM | POA: Diagnosis not present

## 2022-05-04 DIAGNOSIS — E7800 Pure hypercholesterolemia, unspecified: Secondary | ICD-10-CM | POA: Diagnosis not present

## 2022-07-21 DIAGNOSIS — R7303 Prediabetes: Secondary | ICD-10-CM | POA: Diagnosis not present

## 2022-07-21 DIAGNOSIS — Z23 Encounter for immunization: Secondary | ICD-10-CM | POA: Diagnosis not present

## 2022-07-21 DIAGNOSIS — Z789 Other specified health status: Secondary | ICD-10-CM | POA: Diagnosis not present

## 2022-07-21 DIAGNOSIS — R351 Nocturia: Secondary | ICD-10-CM | POA: Diagnosis not present

## 2022-07-21 DIAGNOSIS — Z760 Encounter for issue of repeat prescription: Secondary | ICD-10-CM | POA: Diagnosis not present

## 2022-07-21 DIAGNOSIS — Z0130 Encounter for examination of blood pressure without abnormal findings: Secondary | ICD-10-CM | POA: Diagnosis not present

## 2023-04-28 DIAGNOSIS — Z0130 Encounter for examination of blood pressure without abnormal findings: Secondary | ICD-10-CM | POA: Diagnosis not present

## 2023-04-28 DIAGNOSIS — R7303 Prediabetes: Secondary | ICD-10-CM | POA: Diagnosis not present

## 2023-04-28 DIAGNOSIS — Z789 Other specified health status: Secondary | ICD-10-CM | POA: Diagnosis not present

## 2023-04-28 DIAGNOSIS — Z0000 Encounter for general adult medical examination without abnormal findings: Secondary | ICD-10-CM | POA: Diagnosis not present

## 2023-04-28 DIAGNOSIS — R32 Unspecified urinary incontinence: Secondary | ICD-10-CM | POA: Diagnosis not present

## 2023-04-28 DIAGNOSIS — L720 Epidermal cyst: Secondary | ICD-10-CM | POA: Diagnosis not present

## 2023-04-28 DIAGNOSIS — R5383 Other fatigue: Secondary | ICD-10-CM | POA: Diagnosis not present

## 2023-04-28 DIAGNOSIS — I890 Lymphedema, not elsewhere classified: Secondary | ICD-10-CM | POA: Diagnosis not present

## 2023-04-28 DIAGNOSIS — Z760 Encounter for issue of repeat prescription: Secondary | ICD-10-CM | POA: Diagnosis not present

## 2023-04-28 DIAGNOSIS — R351 Nocturia: Secondary | ICD-10-CM | POA: Diagnosis not present

## 2023-05-01 DIAGNOSIS — Z789 Other specified health status: Secondary | ICD-10-CM | POA: Diagnosis not present

## 2023-05-01 DIAGNOSIS — M25512 Pain in left shoulder: Secondary | ICD-10-CM | POA: Diagnosis not present

## 2023-05-01 DIAGNOSIS — L720 Epidermal cyst: Secondary | ICD-10-CM | POA: Diagnosis not present

## 2023-05-01 DIAGNOSIS — R7303 Prediabetes: Secondary | ICD-10-CM | POA: Diagnosis not present

## 2023-05-01 DIAGNOSIS — R32 Unspecified urinary incontinence: Secondary | ICD-10-CM | POA: Diagnosis not present

## 2023-05-01 DIAGNOSIS — Z760 Encounter for issue of repeat prescription: Secondary | ICD-10-CM | POA: Diagnosis not present

## 2023-05-01 DIAGNOSIS — M25511 Pain in right shoulder: Secondary | ICD-10-CM | POA: Diagnosis not present

## 2023-05-01 DIAGNOSIS — I890 Lymphedema, not elsewhere classified: Secondary | ICD-10-CM | POA: Diagnosis not present

## 2023-05-01 DIAGNOSIS — Z0130 Encounter for examination of blood pressure without abnormal findings: Secondary | ICD-10-CM | POA: Diagnosis not present

## 2023-07-21 DIAGNOSIS — R350 Frequency of micturition: Secondary | ICD-10-CM | POA: Diagnosis not present

## 2023-07-21 DIAGNOSIS — N3941 Urge incontinence: Secondary | ICD-10-CM | POA: Diagnosis not present

## 2023-07-21 DIAGNOSIS — R351 Nocturia: Secondary | ICD-10-CM | POA: Diagnosis not present

## 2024-03-13 DIAGNOSIS — M25512 Pain in left shoulder: Secondary | ICD-10-CM | POA: Diagnosis not present

## 2024-03-13 DIAGNOSIS — R42 Dizziness and giddiness: Secondary | ICD-10-CM | POA: Diagnosis not present

## 2024-03-13 DIAGNOSIS — R609 Edema, unspecified: Secondary | ICD-10-CM | POA: Diagnosis not present

## 2024-03-13 DIAGNOSIS — E6609 Other obesity due to excess calories: Secondary | ICD-10-CM | POA: Diagnosis not present

## 2024-03-13 DIAGNOSIS — M25511 Pain in right shoulder: Secondary | ICD-10-CM | POA: Diagnosis not present

## 2024-03-13 DIAGNOSIS — Z789 Other specified health status: Secondary | ICD-10-CM | POA: Diagnosis not present

## 2024-03-13 DIAGNOSIS — R7303 Prediabetes: Secondary | ICD-10-CM | POA: Diagnosis not present

## 2024-03-16 DIAGNOSIS — M19011 Primary osteoarthritis, right shoulder: Secondary | ICD-10-CM | POA: Diagnosis not present

## 2024-03-16 DIAGNOSIS — M19012 Primary osteoarthritis, left shoulder: Secondary | ICD-10-CM | POA: Diagnosis not present

## 2024-04-06 DIAGNOSIS — M25511 Pain in right shoulder: Secondary | ICD-10-CM | POA: Diagnosis not present

## 2024-04-06 DIAGNOSIS — M25512 Pain in left shoulder: Secondary | ICD-10-CM | POA: Diagnosis not present

## 2024-04-12 DIAGNOSIS — M25512 Pain in left shoulder: Secondary | ICD-10-CM | POA: Diagnosis not present

## 2024-04-12 DIAGNOSIS — M25511 Pain in right shoulder: Secondary | ICD-10-CM | POA: Diagnosis not present

## 2024-04-14 DIAGNOSIS — R001 Bradycardia, unspecified: Secondary | ICD-10-CM | POA: Diagnosis not present

## 2024-04-14 DIAGNOSIS — Z9889 Other specified postprocedural states: Secondary | ICD-10-CM | POA: Diagnosis not present

## 2024-04-14 DIAGNOSIS — M7989 Other specified soft tissue disorders: Secondary | ICD-10-CM | POA: Diagnosis not present

## 2024-04-14 DIAGNOSIS — I700 Atherosclerosis of aorta: Secondary | ICD-10-CM | POA: Diagnosis not present

## 2024-04-14 DIAGNOSIS — N183 Chronic kidney disease, stage 3 unspecified: Secondary | ICD-10-CM | POA: Diagnosis not present

## 2024-04-14 DIAGNOSIS — R002 Palpitations: Secondary | ICD-10-CM | POA: Diagnosis not present

## 2024-04-14 DIAGNOSIS — I10 Essential (primary) hypertension: Secondary | ICD-10-CM | POA: Diagnosis not present

## 2024-05-01 DIAGNOSIS — R002 Palpitations: Secondary | ICD-10-CM | POA: Diagnosis not present

## 2024-05-26 DIAGNOSIS — R9431 Abnormal electrocardiogram [ECG] [EKG]: Secondary | ICD-10-CM | POA: Diagnosis not present

## 2024-06-02 DIAGNOSIS — I700 Atherosclerosis of aorta: Secondary | ICD-10-CM | POA: Diagnosis not present

## 2024-06-02 DIAGNOSIS — I10 Essential (primary) hypertension: Secondary | ICD-10-CM | POA: Diagnosis not present

## 2024-06-02 DIAGNOSIS — I4719 Other supraventricular tachycardia: Secondary | ICD-10-CM | POA: Diagnosis not present

## 2024-06-02 DIAGNOSIS — N183 Chronic kidney disease, stage 3 unspecified: Secondary | ICD-10-CM | POA: Diagnosis not present

## 2024-09-13 DIAGNOSIS — E6609 Other obesity due to excess calories: Secondary | ICD-10-CM | POA: Diagnosis not present

## 2024-09-13 DIAGNOSIS — M25511 Pain in right shoulder: Secondary | ICD-10-CM | POA: Diagnosis not present

## 2024-09-13 DIAGNOSIS — R609 Edema, unspecified: Secondary | ICD-10-CM | POA: Diagnosis not present

## 2024-09-13 DIAGNOSIS — R32 Unspecified urinary incontinence: Secondary | ICD-10-CM | POA: Diagnosis not present

## 2024-09-13 DIAGNOSIS — Z789 Other specified health status: Secondary | ICD-10-CM | POA: Diagnosis not present

## 2024-09-13 DIAGNOSIS — R5383 Other fatigue: Secondary | ICD-10-CM | POA: Diagnosis not present

## 2024-09-13 DIAGNOSIS — E559 Vitamin D deficiency, unspecified: Secondary | ICD-10-CM | POA: Diagnosis not present

## 2024-09-13 DIAGNOSIS — R7303 Prediabetes: Secondary | ICD-10-CM | POA: Diagnosis not present

## 2024-09-13 DIAGNOSIS — Z760 Encounter for issue of repeat prescription: Secondary | ICD-10-CM | POA: Diagnosis not present

## 2024-09-13 DIAGNOSIS — M25512 Pain in left shoulder: Secondary | ICD-10-CM | POA: Diagnosis not present

## 2024-09-16 DIAGNOSIS — R7303 Prediabetes: Secondary | ICD-10-CM | POA: Diagnosis not present

## 2024-09-16 DIAGNOSIS — E6609 Other obesity due to excess calories: Secondary | ICD-10-CM | POA: Diagnosis not present

## 2024-09-16 DIAGNOSIS — R030 Elevated blood-pressure reading, without diagnosis of hypertension: Secondary | ICD-10-CM | POA: Diagnosis not present

## 2024-09-16 DIAGNOSIS — M25512 Pain in left shoulder: Secondary | ICD-10-CM | POA: Diagnosis not present

## 2024-09-16 DIAGNOSIS — M25511 Pain in right shoulder: Secondary | ICD-10-CM | POA: Diagnosis not present

## 2024-09-16 DIAGNOSIS — R32 Unspecified urinary incontinence: Secondary | ICD-10-CM | POA: Diagnosis not present

## 2024-09-16 DIAGNOSIS — Z789 Other specified health status: Secondary | ICD-10-CM | POA: Diagnosis not present
